# Patient Record
Sex: Female | Born: 1955 | Race: White | Hispanic: No | Marital: Married | Smoking: Never smoker
Health system: Southern US, Community
[De-identification: ages and names within clinical notes are randomized; demographics above are authoritative.]

## PROBLEM LIST (undated history)

## (undated) DIAGNOSIS — M1990 Unspecified osteoarthritis, unspecified site: Secondary | ICD-10-CM

## (undated) DIAGNOSIS — R9389 Abnormal findings on diagnostic imaging of other specified body structures: Secondary | ICD-10-CM

## (undated) DIAGNOSIS — E569 Vitamin deficiency, unspecified: Secondary | ICD-10-CM

## (undated) DIAGNOSIS — K760 Fatty (change of) liver, not elsewhere classified: Secondary | ICD-10-CM

## (undated) DIAGNOSIS — K635 Polyp of colon: Secondary | ICD-10-CM

## (undated) DIAGNOSIS — M329 Systemic lupus erythematosus, unspecified: Secondary | ICD-10-CM

## (undated) DIAGNOSIS — E079 Disorder of thyroid, unspecified: Secondary | ICD-10-CM

## (undated) DIAGNOSIS — IMO0002 Reserved for concepts with insufficient information to code with codable children: Secondary | ICD-10-CM

## (undated) DIAGNOSIS — E785 Hyperlipidemia, unspecified: Secondary | ICD-10-CM

## (undated) DIAGNOSIS — H9190 Unspecified hearing loss, unspecified ear: Secondary | ICD-10-CM

## (undated) DIAGNOSIS — M3500 Sicca syndrome, unspecified: Secondary | ICD-10-CM

## (undated) DIAGNOSIS — L219 Seborrheic dermatitis, unspecified: Secondary | ICD-10-CM

## (undated) DIAGNOSIS — D649 Anemia, unspecified: Secondary | ICD-10-CM

## (undated) DIAGNOSIS — K5790 Diverticulosis of intestine, part unspecified, without perforation or abscess without bleeding: Secondary | ICD-10-CM

## (undated) HISTORY — DX: Polyp of colon: K63.5

## (undated) HISTORY — PX: OTHER SURGICAL HISTORY: SHX169

## (undated) HISTORY — DX: Unspecified hearing loss, unspecified ear: H91.90

## (undated) HISTORY — DX: Anemia, unspecified: D64.9

## (undated) HISTORY — DX: Systemic lupus erythematosus, unspecified: M32.9

## (undated) HISTORY — DX: Unspecified osteoarthritis, unspecified site: M19.90

## (undated) HISTORY — DX: Hyperlipidemia, unspecified: E78.5

## (undated) HISTORY — DX: Fatty (change of) liver, not elsewhere classified: K76.0

## (undated) HISTORY — DX: Reserved for concepts with insufficient information to code with codable children: IMO0002

## (undated) HISTORY — DX: Sjogren syndrome, unspecified: M35.00

## (undated) HISTORY — DX: Diverticulosis of intestine, part unspecified, without perforation or abscess without bleeding: K57.90

## (undated) HISTORY — DX: Vitamin deficiency, unspecified: E56.9

## (undated) HISTORY — DX: Seborrheic dermatitis, unspecified: L21.9

## (undated) HISTORY — DX: Abnormal findings on diagnostic imaging of other specified body structures: R93.89

## (undated) HISTORY — DX: Disorder of thyroid, unspecified: E07.9

---

## 2008-02-22 HISTORY — PX: ESOPHAGOGASTRODUODENOSCOPY: SHX1529

## 2008-02-22 HISTORY — PX: COLONOSCOPY: SHX174

## 2011-11-21 ENCOUNTER — Encounter (INDEPENDENT_AMBULATORY_CARE_PROVIDER_SITE_OTHER): Payer: Self-pay | Admitting: Surgery

## 2011-11-21 ENCOUNTER — Ambulatory Visit (INDEPENDENT_AMBULATORY_CARE_PROVIDER_SITE_OTHER): Payer: Private Health Insurance - Indemnity | Admitting: Surgery

## 2011-11-21 VITALS — BP 110/61 | HR 61 | Temp 98.0°F | Resp 18 | Ht 65.0 in | Wt 125.6 lb

## 2011-11-21 DIAGNOSIS — L723 Sebaceous cyst: Secondary | ICD-10-CM

## 2011-11-21 DIAGNOSIS — L089 Local infection of the skin and subcutaneous tissue, unspecified: Secondary | ICD-10-CM | POA: Insufficient documentation

## 2011-11-21 NOTE — Progress Notes (Signed)
Subjective:     Patient ID: Brittney Jones, female   DOB: [birth year], 56 y.o.   MRN: [id number]  HPI She is here for evaluation of an infected sebaceous cyst on her left buttock. It has been present for many years but recently became tender and painful last week.  Review of Systems     Objective:   Physical Exam On exam, she indeed has an infected sebaceous cyst on the left buttock near the midline above the gluteal cleft. I prepped the area Betadine, anesthetized with lidocaine, an incision with a scalpel, and and drained an abscess cavity. I then packed with gauze    Assessment:     Infected sebaceous cyst    Plan:     I am going to place her on Keflex and hydrocodone. Wound care instructions were given. I will see her back in one week

## 2011-11-22 ENCOUNTER — Telehealth (INDEPENDENT_AMBULATORY_CARE_PROVIDER_SITE_OTHER): Payer: Self-pay | Admitting: General Surgery

## 2011-11-22 NOTE — Telephone Encounter (Signed)
Pt called and has daughter there to help with dressing change.  She is a Theatre stage manager.  Discussed how to preform the dressing change, using clean technique.  Advised to take pain medicine about 30 minutes ahead.  She understands.

## 2011-11-29 ENCOUNTER — Ambulatory Visit (INDEPENDENT_AMBULATORY_CARE_PROVIDER_SITE_OTHER): Payer: Private Health Insurance - Indemnity | Admitting: Surgery

## 2011-11-29 VITALS — BP 119/60 | HR 80 | Temp 97.7°F | Resp 16 | Ht 65.0 in | Wt 128.4 lb

## 2011-11-29 DIAGNOSIS — Z09 Encounter for follow-up examination after completed treatment for conditions other than malignant neoplasm: Secondary | ICD-10-CM

## 2011-11-29 NOTE — Progress Notes (Signed)
Subjective:     Patient ID: Brittney Jones, female   DOB: 08-11-1955, 55 y.o.   MRN: [id number]  HPI She is here for a followup visit status post incision and drainage of a infected sebaceous cyst on her buttock She has no complaints. Review of Systems     Objective:   Physical Exam The incision and drainage site in the left upper buttock is healed well    Assessment:     Patient stable post incision and drainage of infected sebaceous cyst    Plan:     She will come back and see me if the cyst Recurs

## 2014-09-25 ENCOUNTER — Encounter: Payer: Self-pay | Admitting: Gastroenterology

## 2014-11-19 ENCOUNTER — Ambulatory Visit (INDEPENDENT_AMBULATORY_CARE_PROVIDER_SITE_OTHER): Payer: Managed Care, Other (non HMO) | Admitting: Gastroenterology

## 2014-11-19 ENCOUNTER — Encounter: Payer: Self-pay | Admitting: Gastroenterology

## 2014-11-19 VITALS — BP 118/60 | HR 66 | Ht 65.0 in | Wt 150.0 lb

## 2014-11-19 DIAGNOSIS — R195 Other fecal abnormalities: Secondary | ICD-10-CM | POA: Diagnosis not present

## 2014-11-19 DIAGNOSIS — D649 Anemia, unspecified: Secondary | ICD-10-CM | POA: Diagnosis not present

## 2014-11-19 MED ORDER — NA SULFATE-K SULFATE-MG SULF 17.5-3.13-1.6 GM/177ML PO SOLN
ORAL | Status: DC
Start: 2014-11-19 — End: 2014-12-25

## 2014-11-19 MED ORDER — OMEPRAZOLE 40 MG PO CPDR: 40.0000 mg | DELAYED_RELEASE_CAPSULE | Freq: Every day | ORAL | Status: DC

## 2014-11-19 NOTE — Patient Instructions (Signed)
We have sent the following medications to your pharmacy for you to pick up at your convenience: Suprep; Omeprazole  You have been scheduled for an endoscopy and colonoscopy. Please follow the written instructions given to you at your visit today. Please pick up your prep supplies at the pharmacy within the next 1-3 days. If you use inhalers (even only as needed), please bring them with you on the day of your procedure. Your physician has requested that you go to www.startemmi.com and enter the access code given to you at your visit today. This web site gives a general overview about your procedure. However, you should still follow specific instructions given to you by our office regarding your preparation for the procedure.

## 2014-11-19 NOTE — Progress Notes (Signed)
HPI :  59 y/o female seen in consultation for symptoms of blood in the stools. She is new to our clinic.   She reports she had new symptoms of some "dark and tarry stools" which started in the November / December time frame, which has occurred intermittently since that time. At present time she sees both dark, tarry stools, as well as some stools with bright red blood, that occur intermittently, roughly every other day. She denies any abdominal pains or perianal pain. She has some baseline constipation, takes probiotic, having roughly a few BMS per day at this time and is stable. She states she sees blood intermittently, but the dark stools are occuring fairly frequently. She takes an iron supplement and has been on this since 2000 for reported anemia (old labs not available). She reports her stool color has significantly changed since November but states she was on iron prior to this. She denies any significant change in weight during this time. She exercises a few times per week without limitations. No chest pain or shortness of breath. She eats well, no dysphagia but endorses a "tightness" in her esophagus when she swallows at times. No vomiting. She does have some postprandial abdominal discomfort in her abdomen at times after she eats, in her mid left upper abdomen which can come and go. She denies NSAIDs. No aspirin.   She reports having had a colonoscopy done about 7 years ago. Report not available at the time. No polyps per patient, but sigmoid diverticulosis. She also had an EGD for dysphagia she thinks around 7 years ago as well. No problems per patient. We are in process of obtaining these reports.   She has a FH of colon polyps in her parents, she thinks an aunt or uncle may have had colon cancer but is unclear who has it.   She report she has been anemic historically and has been on iron for this issue.   She thinks she was told she has Sjogren's syndrome based on prior lip biopsy, and  states she has "positive lupus tests". She also reports a history of pernicious anemia and is on B12. She denies routine alcohol use.      Past Medical History  Diagnosis Date  . Arthritis   . Diverticulosis   . Hyperlipidemia   . Thyroid disease   . Lupus   . Dermatitis seborrheica   . Problems with hearing     auto immune  . Vitamin deficiency     several  . Anemia     both pernicious anemia and iron deficiency per patient  . Fatty liver      Past Surgical History  Procedure Laterality Date  . Lip biospy    . Scalp biospy    . Colonoscopy  2010    in [place]  . Esophagogastroduodenoscopy  2010    in [place]   Family History  Problem Relation Age of Onset  . Breast cancer      mat great aunt  . Colon polyps Mother   . Colon polyps Father   . Colon polyps Brother   . Colon polyps Sister   . Clotting disorder Maternal Uncle   . Diabetes Paternal Aunt   . Stroke Maternal Uncle   . Liver disease      multiple people on mom's side  . Heart disease Mother   . Heart disease Father   . Kidney cancer Paternal Grandmother   . Leukemia Maternal Grandmother   .  Lung cancer Maternal Uncle   . Stroke Maternal Grandfather    Social History  Substance Use Topics  . Smoking status: Never Smoker   . Smokeless tobacco: Never Used  . Alcohol Use: 0.0 oz/week    0 Standard drinks or equivalent per week   Current Outpatient Prescriptions  Medication Sig Dispense Refill  . Choline & Mag Salicylates 4098 MG TABS Take by mouth daily.    Marland Kitchen DHEA 10 MG TABS Take 10 mg by mouth daily.    . Digestive Enzymes (DIGESTIVE ENZYME PO) Take by mouth daily.    . fluconazole (DIFLUCAN) 200 MG tablet Take 200 mg by mouth as needed.    Marland Kitchen levothyroxine (SYNTHROID, LEVOTHROID) 50 MCG tablet Take 50 mcg by mouth daily before breakfast.    . liothyronine (CYTOMEL) 25 MCG tablet Take 25 mcg by mouth daily.    . Magnesium 400 MG CAPS Take 400 mg by mouth daily.    . Methylcobalamin (B12-ACTIVE PO) Take  by mouth daily.    . Misc Natural Products (COLON CLEANSE PO) Take by mouth. 3 times a year    . Multiple Vitamins-Minerals (MULTIVITAMIN ADULT PO) Take by mouth daily.    . Nutritional Supplements (CELLULAR FORTE PO) Take by mouth daily.    . Omega 3 1200 MG CAPS Take by mouth daily.    . Probiotic Product (PROBIOTIC ADVANCED PO) Take by mouth daily.    . Na Sulfate-K Sulfate-Mg Sulf SOLN Take as directed per Colonoscopy. 354 mL 0  . omeprazole (PRILOSEC) 40 MG capsule Take 1 capsule (40 mg total) by mouth daily. 60 capsule 1   No current facility-administered medications for this visit.   Also takes several vitamin supplements  No Known Allergies   Review of Systems: All systems reviewed and negative except where noted in HPI.   No labs in Epic  Labs from her primary care brought show (+) FOBT testing, iron levels normal as of 09/22/2014, Hgb of 12.9 with MCV of 89, while on oral iron  Physical Exam: BP 118/60 mmHg  Pulse 66  Ht 5\' 5"  (1.651 m)  Wt 150 lb (68.04 kg)  BMI 24.96 kg/m2 Constitutional: Pleasant, white female in no acute distress. HEENT: Normocephalic and atraumatic. Conjunctivae are normal. No scleral icterus. Neck supple.  Cardiovascular: Normal rate, regular rhythm.  Pulmonary/chest: Effort normal and breath sounds normal. No wheezing, rales or rhonchi. Abdominal: Soft, nondistended, nontender. Bowel sounds active throughout. There are no masses palpable. No hepatomegaly. Rectal exam deferred.  Extremities: no edema Lymphadenopathy: No cervical adenopathy noted. Neurological: Alert and oriented to person place and time. Skin: Skin is warm and dry. No rashes noted. Psychiatric: Normal mood and affect. Behavior is normal.   ASSESSMENT AND PLAN: 59 y/o female with history as above, seen in consultation for symptoms of blood in the stools. The patient has had "dark stools" and stools with bright red blood for several months. She is FOBT positive. Recent labs on  iron show normal Hgb and iron stores, however she tells me she has a history of both iron deficiency and pernicious anemia. Other than longstanding mild dysphagia she otherwise has no abdominal pain, weight loss, or significant changes in bowels. Last endoscopic exam was 7 years ago, we will obtain these reports but patient states nothing significant noted.   Given her history of seeing blood in stools and reported anemia prior to starting iron, recommend we start with upper and lower endoscopy to further evaluate these complaints. In the interim  until this is done, recommend she try empiric PPI daily given her report of "dark stools", will start omeprazole 40mg  once daily, and ask that she avoid NSAIDs. If our exam is unremarkable may consider capsule endoscopy. Of note, given her history of pernicious anemia will plan on biopsies of the stomach even if normal. Deferred rectal exam to time of colonoscopy.   The indications, risks, and benefits of EGD and colonoscopy were explained to the patient in detail. Risks include but are not limited to bleeding, perforation, adverse reaction to medications, and cardiopulmonary compromise. Sequelae include but are not limited to the possibility of surgery, hositalization, and mortality. The patient verbalized understanding and wished to proceed. All questions answered, referred to scheduler and bowel prep ordered. Further recommendations pending results of the exam.   [place] Cellar, MD [facility]

## 2014-11-20 ENCOUNTER — Telehealth: Payer: Self-pay

## 2014-11-20 ENCOUNTER — Encounter: Payer: Self-pay | Admitting: Gastroenterology

## 2014-11-20 NOTE — Telephone Encounter (Signed)
Called patient to clarify the question she was asking. I told her I would respond once I hear from Dr. Havery Moros

## 2014-11-20 NOTE — Telephone Encounter (Signed)
Dr. Havery Moros specifically wants to do the [facility] test with you taking omeprazole as he may be looking for how well you react to the medications. I will send him a note with your concerns and contact you when he responds.

## 2014-11-22 DIAGNOSIS — R9389 Abnormal findings on diagnostic imaging of other specified body structures: Secondary | ICD-10-CM

## 2014-11-22 HISTORY — DX: Abnormal findings on diagnostic imaging of other specified body structures: R93.89

## 2014-11-25 NOTE — Telephone Encounter (Signed)
Called pt and informed her what Dr Havery Moros suggested.

## 2014-12-02 ENCOUNTER — Telehealth: Payer: Self-pay | Admitting: Gastroenterology

## 2014-12-02 NOTE — Telephone Encounter (Signed)
Records obtained from prior workup for the patient. This note is to document the following  Colonoscopy 08/19/2008 - mild sigmoid diverticulosis, otherwise normal colon and ileum EGD 03/28/2008 - normal esophagus. Random biopsies obtained to rule out EoE. Empiric dilation with 54Fr [facility] dilator performed. Normal stomach and duodenum.  We will await results of her scheduled endoscopy and course on empiric PPI.

## 2014-12-25 ENCOUNTER — Encounter: Payer: Self-pay | Admitting: Gastroenterology

## 2014-12-25 ENCOUNTER — Ambulatory Visit (AMBULATORY_SURGERY_CENTER): Payer: Managed Care, Other (non HMO) | Admitting: Gastroenterology

## 2014-12-25 VITALS — BP 113/68 | HR 72 | Temp 97.3°F | Resp 25 | Ht 65.0 in | Wt 150.0 lb

## 2014-12-25 DIAGNOSIS — D120 Benign neoplasm of cecum: Secondary | ICD-10-CM | POA: Diagnosis not present

## 2014-12-25 DIAGNOSIS — D125 Benign neoplasm of sigmoid colon: Secondary | ICD-10-CM | POA: Diagnosis not present

## 2014-12-25 DIAGNOSIS — D649 Anemia, unspecified: Secondary | ICD-10-CM | POA: Diagnosis not present

## 2014-12-25 DIAGNOSIS — D122 Benign neoplasm of ascending colon: Secondary | ICD-10-CM

## 2014-12-25 DIAGNOSIS — K2970 Gastritis, unspecified, without bleeding: Secondary | ICD-10-CM

## 2014-12-25 DIAGNOSIS — R195 Other fecal abnormalities: Secondary | ICD-10-CM

## 2014-12-25 DIAGNOSIS — K2990 Gastroduodenitis, unspecified, without bleeding: Secondary | ICD-10-CM

## 2014-12-25 DIAGNOSIS — K633 Ulcer of intestine: Secondary | ICD-10-CM | POA: Diagnosis not present

## 2014-12-25 MED ORDER — SODIUM CHLORIDE 0.9 % IV SOLN: 500.0000 mL | INTRAVENOUS | Status: DC

## 2014-12-25 NOTE — Patient Instructions (Signed)

## 2014-12-25 NOTE — Op Note (Signed)
[place]  [address] [place], [address]   ENDOSCOPY PROCEDURE REPORT  PATIENT: Brittney, Jones  MR#: [id number] BIRTHDATE: 03-15-1955 , 59  yrs. old GENDER: female ENDOSCOPIST: Yetta Flock, MD REFERRED BY: PROCEDURE DATE:  12/25/2014 PROCEDURE:  EGD w/ biopsy ASA CLASS:     Class II INDICATIONS:  history of blood in the stools, reported iron deficiency, reported pernicious anemia. MEDICATIONS: Propofol 250 mg IV TOPICAL ANESTHETIC:  DESCRIPTION OF PROCEDURE: After the risks benefits and alternatives of the procedure were thoroughly explained, informed consent was obtained.  The LB [id number] endoscope was introduced through the mouth and advanced to the second portion of the duodenum , Without limitations.  The instrument was slowly withdrawn as the mucosa was fully examined.  FINDINGS:The esophagus was normal.  No stenosis or stricture noted. SCJ was normal.  DH, GEJ, and SCJ were located 38cm from the incisors.  The stomach had some mild, subtle, patchy erythema but otherwise was normal.  Biopsies were taken from the antrum and body to evaluate for auto-immune gastritis and intestinal metaplasia given the reported history of pernicious anemia.  The duodenal bulb and 2nd portion of the duodenum were normal.  Random biopsies taken to rule out celiac in light of reported history of iron deficiency. Retroflexed views revealed no abnormalities.     The scope was then withdrawn from the patient and the procedure completed.  COMPLICATIONS: There were no immediate complications.  ENDOSCOPIC IMPRESSION: Normal esophagus Mild subtle patchy erythema of the stomach, biopsies taken as above Normal duodenum, biopsies taken as above    RECOMMENDATIONS: Await pathology results Resume medications Resume diet    eSigned:  Yetta Flock, MD 12/25/2014 4:19 PM    CC: the patient  PATIENT NAME:  Brittney, Jones MR#: [id number]

## 2014-12-25 NOTE — Progress Notes (Signed)
Transferred to recovery room. A/O x3, pleased with MAC.  VSS.  Report to Penny, RN. 

## 2014-12-25 NOTE — Op Note (Signed)
[place]  [address] [place], [address]   COLONOSCOPY PROCEDURE REPORT  PATIENT: Brittney, Jones  MR#: [id number] BIRTHDATE: 16-Aug-1955 , 59  yrs. old GENDER: female ENDOSCOPIST: Yetta Flock, MD REFERRED BY: PROCEDURE DATE:  12/25/2014 PROCEDURE:   Colonoscopy, diagnostic and Colonoscopy with biopsy First Screening Colonoscopy - Avg.  risk and is 50 yrs.  old or older - No.  Prior Negative Screening - Now for repeat screening. Other: See Comments  History of Adenoma - Now for follow-up colonoscopy & has been > or = to 3 yrs.  N/A  Polyps removed today? Yes ASA CLASS:   Class II INDICATIONS:Evaluation of unexplained GI bleeding and Colorectal Neoplasm Risk Assessment for this procedure is average risk. MEDICATIONS: Propofol 450 mg IV  DESCRIPTION OF PROCEDURE:   After the risks benefits and alternatives of the procedure were thoroughly explained, informed consent was obtained.  The digital rectal exam revealed no abnormalities of the rectum.   The LB [id number]  endoscope was introduced through the anus and advanced to the terminal ileum which was intubated for a short distance. No adverse events experienced.   The quality of the prep was adequate  The instrument was then slowly withdrawn as the colon was fully examined. Estimated blood loss is zero unless otherwise noted in this procedure report.  COLON FINDINGS: A 58mm sessile polyp was noted in the cecum and removed with cold forceps.  A diminutive sessile polyp was noted in the ascending colon and removed with cold forceps.  A 40mm sessile polyp was noted in the sigmoid colon and in the rectum, both removed with cold forceps.  The remainder of the examined colonic mucosa was normal. Two non bleeding apthous ulcerations were noted in the terminal ileum.  The ileum was otherwise normal without other inflammatory changes. Biopsies were obtained from the ileum. Retroflexed views revealed  internal hemorrhoids. The time to cecum = 6.9 Withdrawal time = 20.6   The scope was withdrawn and the procedure completed. COMPLICATIONS: There were no immediate complications.  ENDOSCOPIC IMPRESSION: 4 colon polyps removed as outlined above Two apthous ulcerations noted in the terminal ileum of unclear signficance - biopsies taken to rule out Crohns disease Otherwise normal appearing colon Small internal hemorrhoids noted which could account for some of the patient's rectal bleeding  RECOMMENDATIONS: Daily fiber supplement as tolerated to treat hemorrhoids Await pathology results Avoid all NSAIDs Resume diet Resume medications Consideration for capsule endoscopy to further evaluate the small bowel. We will first await pathology results.  eSigned:  Yetta Flock, MD 12/25/2014 4:13 PM   cc:  the patient   PATIENT NAME:  Brittney, Jones MR#: [id number]

## 2014-12-25 NOTE — Progress Notes (Signed)
Called to room to assist during endoscopic procedure.  Patient ID and intended procedure confirmed with present staff. Received instructions for my participation in the procedure from the performing physician.  

## 2014-12-26 ENCOUNTER — Other Ambulatory Visit: Payer: Self-pay | Admitting: Internal Medicine

## 2014-12-26 ENCOUNTER — Telehealth: Payer: Self-pay | Admitting: *Deleted

## 2014-12-26 DIAGNOSIS — E041 Nontoxic single thyroid nodule: Secondary | ICD-10-CM

## 2014-12-26 NOTE — Telephone Encounter (Signed)
  Follow up Call-  Call back number 12/25/2014  Post procedure Call Back phone  # [phone]  Permission to leave phone message Yes    [facility]

## 2014-12-31 ENCOUNTER — Other Ambulatory Visit: Payer: Managed Care, Other (non HMO)

## 2015-01-02 ENCOUNTER — Telehealth: Payer: Self-pay | Admitting: Gastroenterology

## 2015-01-02 NOTE — Telephone Encounter (Signed)
Spoke with patient and reviewed results and recommendations.

## 2015-01-06 ENCOUNTER — Ambulatory Visit (INDEPENDENT_AMBULATORY_CARE_PROVIDER_SITE_OTHER): Payer: Self-pay | Admitting: Internal Medicine

## 2015-01-06 ENCOUNTER — Other Ambulatory Visit: Payer: Self-pay

## 2015-01-06 DIAGNOSIS — D508 Other iron deficiency anemias: Secondary | ICD-10-CM

## 2015-01-06 DIAGNOSIS — D509 Iron deficiency anemia, unspecified: Secondary | ICD-10-CM

## 2015-01-06 NOTE — Progress Notes (Signed)
Patient here for capsule endo teaching. Verbalizes understanding for all written and verbal instructions. 

## 2015-01-07 ENCOUNTER — Encounter: Payer: Self-pay | Admitting: Internal Medicine

## 2015-01-08 ENCOUNTER — Ambulatory Visit (INDEPENDENT_AMBULATORY_CARE_PROVIDER_SITE_OTHER): Payer: Managed Care, Other (non HMO) | Admitting: Gastroenterology

## 2015-01-08 DIAGNOSIS — D649 Anemia, unspecified: Secondary | ICD-10-CM

## 2015-01-08 NOTE — Progress Notes (Signed)
Patient here for capsule endoscopy.  Patient has completed the prep and has not eaten this AM. Patient swallowed capsule.Tolerated procedure. Verbalizes understanding of written and verbal instructions. Capsule # [id number] Expires 2017-12.

## 2015-01-09 ENCOUNTER — Telehealth: Payer: Self-pay | Admitting: Gastroenterology

## 2015-01-09 NOTE — Telephone Encounter (Signed)
Noted  

## 2015-01-12 ENCOUNTER — Telehealth: Payer: Self-pay | Admitting: Gastroenterology

## 2015-01-12 DIAGNOSIS — D509 Iron deficiency anemia, unspecified: Secondary | ICD-10-CM

## 2015-01-12 NOTE — Telephone Encounter (Signed)
Brittney Jones can you please let this patient know capsule results returned. There was a suggestion of mild inflammation in the duodenum but I previously biopsied this and was normal on EGD. There was otherwise no evidence of Crohns disease of the small bowel. If she has not had celiac serology (total IgA and TTG IgA ) can you please order this lab for her. Otherwise, recommend she avoid NSAIDs and see me in the clinic for a follow up to discuss results, but I don't think she has Crohns based on the capsule study. We will need to monitor her over time as things can change however. I will discuss this further with her in clinic. Thanks

## 2015-01-13 ENCOUNTER — Other Ambulatory Visit (INDEPENDENT_AMBULATORY_CARE_PROVIDER_SITE_OTHER): Payer: Managed Care, Other (non HMO)

## 2015-01-13 ENCOUNTER — Encounter: Payer: Self-pay | Admitting: Gastroenterology

## 2015-01-13 DIAGNOSIS — D509 Iron deficiency anemia, unspecified: Secondary | ICD-10-CM

## 2015-01-13 LAB — IGA: IgA: 117 mg/dL (ref 68–378)

## 2015-01-13 NOTE — Telephone Encounter (Signed)
Lab in EPIC. Left a message for patient to call back for results.

## 2015-01-13 NOTE — Telephone Encounter (Signed)
Patient given results and recommendations. She will have labs done.

## 2015-01-14 LAB — TISSUE TRANSGLUTAMINASE, IGA: Tissue Transglutaminase Ab, IgA: 1 U/mL (ref <4)

## 2015-01-16 ENCOUNTER — Encounter: Payer: Self-pay | Admitting: Gastroenterology

## 2015-03-12 ENCOUNTER — Ambulatory Visit (INDEPENDENT_AMBULATORY_CARE_PROVIDER_SITE_OTHER): Payer: Managed Care, Other (non HMO) | Admitting: Gastroenterology

## 2015-03-12 ENCOUNTER — Encounter: Payer: Self-pay | Admitting: Gastroenterology

## 2015-03-12 VITALS — BP 100/60 | HR 78 | Ht 65.0 in | Wt 151.0 lb

## 2015-03-12 DIAGNOSIS — D510 Vitamin B12 deficiency anemia due to intrinsic factor deficiency: Secondary | ICD-10-CM

## 2015-03-12 DIAGNOSIS — K649 Unspecified hemorrhoids: Secondary | ICD-10-CM

## 2015-03-12 DIAGNOSIS — K3189 Other diseases of stomach and duodenum: Secondary | ICD-10-CM | POA: Diagnosis not present

## 2015-03-12 DIAGNOSIS — K2940 Chronic atrophic gastritis without bleeding: Secondary | ICD-10-CM | POA: Diagnosis not present

## 2015-03-12 DIAGNOSIS — K31A0 Gastric intestinal metaplasia, unspecified: Secondary | ICD-10-CM

## 2015-03-12 NOTE — Patient Instructions (Signed)
Please call the office when you are ready to schedule your EGD. [phone]

## 2015-03-12 NOTE — Progress Notes (Signed)
HPI :  LAST VISIT: 60 y/o female seen in consultation for symptoms of blood in the stools. She is new to our clinic.   She reports she had new symptoms of some "dark and tarry stools" which started in the November / December time frame, which has occurred intermittently since that time. At present time she sees both dark, tarry stools, as well as some stools with bright red blood, that occur intermittently, roughly every other day. She denies any abdominal pains or perianal pain. She has some baseline constipation, takes probiotic, having roughly a few BMS per day at this time and is stable. She states she sees blood intermittently, but the dark stools are occuring fairly frequently. She takes an iron supplement and has been on this since 2000 for reported anemia (old labs not available). She reports her stool color has significantly changed since November but states she was on iron prior to this. She denies any significant change in weight during this time. She exercises a few times per week without limitations. No chest pain or shortness of breath. She eats well, no dysphagia but endorses a "tightness" in her esophagus when she swallows at times. No vomiting. She does have some postprandial abdominal discomfort in her abdomen at times after she eats, in her mid left upper abdomen which can come and go. She denies NSAIDs. No aspirin.   She reports having had a colonoscopy done about 7 years ago. Report not available at the time. No polyps per patient, but sigmoid diverticulosis. She also had an EGD for dysphagia she thinks around 7 years ago as well. No problems per patient. We are in process of obtaining these reports.   She has a FH of colon polyps in her parents, she thinks an aunt or uncle may have had colon cancer but is unclear who has it. She report she has been anemic historically and has been on iron for this issue.   She thinks she was told she has Sjogren's syndrome based on prior lip  biopsy, and states she has "positive lupus tests". She also reports a history of pernicious anemia and is on B12. She denies routine alcohol use.  SINCE LAST VISIT:  Patient here for follow up. She is feeling okay in general without any significant symptoms. She had had an EGD, colonoscopy, and capsule study since our last visit with results as outlined below. She denies any NSAID use. She is having roughly 1-2 BMs per day, on probiotics. No diarrhea. Sometimes stools are dark and this occurs intermittently, but she is taking an oral iron supplement. Weight is stable. No abdominal pains. She had som nausea she thinks in relation to vitamin intake, not bothering her much now. She has some rare scant amount red blood in the stools thought to be due to hemorrhoids. She has had negative lab testing for celiac disease.    EGD 12/25/14 - subtle gastric erythema. 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 Otherwise, she had some apthous ulcerations in the ileum noted and hemorrhoids Capsule endoscopy 01/08/15 - no evidence of Crohns disease, normal appearing small bowel  Past Medical History  Diagnosis Date  . Arthritis   . Diverticulosis   . Hyperlipidemia   . Thyroid disease   . Lupus ([place])   .  Dermatitis seborrheica   . Problems with hearing     auto immune  . Vitamin deficiency     several  . Fatty liver   . Abnormal thermography 11/2014    left breast  . Anemia     both pernicious anemia and iron deficiency per patient  . Colon polyp     2 adenomas 2016     Past Surgical History  Procedure Laterality Date  . Lip biospy    . Scalp biospy    . Colonoscopy  2010    in [place]  . Esophagogastroduodenoscopy  2010    in [place]   Family History  Problem Relation Age of Onset  . Breast  cancer      mat great aunt  . Colon polyps Mother   . Colon polyps Father   . Colon polyps Brother   . Colon polyps Sister   . Clotting disorder Maternal Uncle   . Diabetes Paternal Aunt   . Stroke Maternal Uncle   . Liver disease      multiple people on mom's side  . Heart disease Mother   . Heart disease Father   . Kidney cancer Paternal Grandmother   . Leukemia Maternal Grandmother   . Lung cancer Maternal Uncle   . Stroke Maternal Grandfather    Social History  Substance Use Topics  . Smoking status: Never Smoker   . Smokeless tobacco: Never Used  . Alcohol Use: 0.0 oz/week    0 Standard drinks or equivalent per week   Current Outpatient Prescriptions  Medication Sig Dispense Refill  . Cholecalciferol (VITAMIN D3) 10000 units capsule Take 10,000 Units by mouth daily.    . Choline & Mag Salicylates 123XX123 MG TABS Take by mouth daily.    Marland Kitchen DHEA 10 MG TABS Take 10 mg by mouth daily.    . Digestive Enzymes (DIGESTIVE ENZYME PO) Take by mouth daily.    . fluconazole (DIFLUCAN) 200 MG tablet Take 200 mg by mouth as needed.    Marland Kitchen levothyroxine (SYNTHROID, LEVOTHROID) 50 MCG tablet Take 50 mcg by mouth daily before breakfast.    . liothyronine (CYTOMEL) 25 MCG tablet Take 25 mcg by mouth daily.    . Magnesium 400 MG CAPS Take 400 mg by mouth daily.    . Methylcobalamin (B12-ACTIVE PO) Take 5,000 mg by mouth daily.     . Misc Natural Products (COLON CLEANSE PO) Take by mouth. 3 times a year    . Multiple Vitamins-Minerals (MULTIVITAMIN ADULT PO) Take by mouth daily.    . Nutritional Supplements (CELLULAR FORTE PO) Take by mouth daily.    . Omega 3 1200 MG CAPS Take by mouth daily.    . Probiotic Product (PROBIOTIC ADVANCED PO) Take by mouth daily.     No current facility-administered medications for this visit.   No Known Allergies   Review of Systems: All systems reviewed and negative except where noted in HPI.   Normal TTG IgA and IgA levels   Physical Exam: BP 100/60  mmHg  Pulse 78  Ht 5\' 5"  (1.651 m)  Wt 151 lb (68.493 kg)  BMI 25.13 kg/m2 Constitutional: Pleasant,well-developed, female in no acute distress. HEENT: Normocephalic and atraumatic. Conjunctivae are normal. No scleral icterus. Neck supple.  Cardiovascular: Normal rate, regular rhythm.  Pulmonary/chest: Effort normal and breath sounds normal. No wheezing, rales or rhonchi. Abdominal: Soft, nondistended, nontender. Bowel sounds active throughout. There are no masses palpable. No hepatomegaly. Extremities: no edema Lymphadenopathy: No cervical adenopathy  noted. Neurological: Alert and oriented to person place and time. Skin: Skin is warm and dry. No rashes noted. Psychiatric: Normal mood and affect. Behavior is normal.   ASSESSMENT AND PLAN: 59 y/o female with history of pernicious anemia and reported iron deficiency, seen initially in consultation for blood in the stools. The patient had "dark stools" although in the setting of taking oral iron, as well as bright red blood for several months. She was FOBT positive. Colonoscopy showed internal hemorrhoids as the likely cause of her rectal bleeding, with 2 small adenomas, and 2 small apthous ulcerations in the ileum. Biopsies of this area showed focal active inflammation without evidence of chronicity. Capsule endoscopy did not show any evidence of small bowel Crohn's. 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   At this time, regarding her "dark stools" suspect it could be due to her iron supplementation. I did not appreciate any pathology on this workup to cause overt bleeding other than hemorrhoids. I don't think she has Crohn's disease based on lack of chronicity on small bowel biopsies and normal appearing small bowel on capsule. The ileal changes on colonoscopy were quite mild. She denies NSAID use. Unclear if that was a self limited infectious etiology. She generally  feels well without diarrhea or abdominal pain at this time. I would monitor her for symptoms and her labs over time, if she does develop diarrhea or abdominal pain, would consider repeating imaging or colonoscopy to re-evaluate the ileum.   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 I discussed this with them and potential increased risk of gastric malignancy given pernicious anemia and intestinal metaplasia, however the overall risk is thought to remain low. I reviewed the ASGE guidelines on surveillance endoscopy for premalignant conditions of the GI tract. It is controversial whether or not to survey GIM without dysplasia, guidelines currently don't recommend it without other risk factors, as the risk of malignancy in the [place] is thought to be low, but some advocate surveillance or gastric mapping to determine the extent and severity of GIM and assess for dysplastic changes. I offered a gastric mapping procedure to her, although she wishes to think about it, mostly due to cost as her insurance won't cover it at present time and she would pay out of pocket. She will get back to me if she wishes to have a follow up EGD.   I would otherwise monitor her for symptoms, and trend her CBC over time. She can follow up in a year for reassessment or sooner with development of symptoms. Repeat surveillance colonoscopy is recommended in 5 years.   [place] Cellar, MD [facility] Pager [phone]

## 2015-03-19 ENCOUNTER — Encounter: Payer: Self-pay | Admitting: Gastroenterology

## 2015-03-23 ENCOUNTER — Telehealth: Payer: Self-pay | Admitting: *Deleted

## 2015-03-23 NOTE — Telephone Encounter (Signed)
Patient notified of Amy's information and patient given billing's number to call also.

## 2015-03-23 NOTE — Telephone Encounter (Signed)
INSURANCE REQUIRES AN AUTH IF SHE DECIDES TO DO IT. ITS DATE SPECIFIC SO I HAVE TO DO THE AUTH WHEN EVER ITS SCHEDULED. DR A SAYS ITS AN EGD THAT CAN BE DONE IN [facility].       Previous Messages     ----- Message -----   From: Manus Gunning, MD   Sent: 03/20/2015  7:11 AM    To: Darden Dates   Thanks for the email. To clarify, yes it's an EGD. Basically a lot of biopsies done throughout the stomach but it's an EGD, and can be done at the [facility]. Thanks       Spoke with patient and she would like to know the cost of the procedure. She states she has a high deductible and is expecting to have to pay all of it. She understands she will need a precert to have the procedure.

## 2015-03-23 NOTE — Telephone Encounter (Signed)
Left a message for patient to call back. 

## 2015-03-23 NOTE — Telephone Encounter (Signed)
I DONT HAVE EXACT PRICING.  [facility] WOULD HAVE THAT.  I KNOW JUST PHYS AND FACILITY CHARGES WOULD BE APPROX 1200-1500 THAT DOES NOT INCLUDE ANESTHESIA OR PATHOLOGY.  I DONT HAVE THOSE CHARGES.  THAT 1200-1500 IS JUST EGD PROCEDURE WITH PHYSICIAN. SORRY I DONT HAVE ALL COST.

## 2015-07-15 ENCOUNTER — Telehealth: Payer: Self-pay | Admitting: Gastroenterology

## 2015-07-15 NOTE — Telephone Encounter (Signed)
Patient prefers to do this in August or September. The schedules are not available yet. Patient will wait for a call to schedule once it is available.

## 2015-09-07 ENCOUNTER — Telehealth: Payer: Self-pay | Admitting: Gastroenterology

## 2015-09-08 NOTE — Telephone Encounter (Signed)
Yes we can direct book for EGD, she does not need an office visit. Thanks

## 2015-09-08 NOTE — Telephone Encounter (Signed)
Patient needs to be scheduled for  Direct EGD with mapping per Dr. Havery Moros. Does not need OV.

## 2015-09-08 NOTE — Telephone Encounter (Signed)
Patient is calling and asking to schedule gastric mapping. Please, advise if she needs an OV or direct [facility].

## 2015-09-10 ENCOUNTER — Encounter: Payer: Self-pay | Admitting: Gastroenterology

## 2015-10-08 ENCOUNTER — Ambulatory Visit (AMBULATORY_SURGERY_CENTER): Payer: Self-pay

## 2015-10-08 VITALS — Ht 64.5 in | Wt 164.2 lb

## 2015-10-08 DIAGNOSIS — K2940 Chronic atrophic gastritis without bleeding: Secondary | ICD-10-CM

## 2015-10-08 NOTE — Progress Notes (Signed)
No allergies to eggs or soy No past problems with anesthesia No diet meds No home oxygen  Has email and internet; declined emmi 

## 2015-10-14 ENCOUNTER — Encounter: Payer: Self-pay | Admitting: Gastroenterology

## 2015-10-21 ENCOUNTER — Encounter: Payer: Self-pay | Admitting: Gastroenterology

## 2015-10-21 ENCOUNTER — Ambulatory Visit (AMBULATORY_SURGERY_CENTER): Payer: Managed Care, Other (non HMO) | Admitting: Gastroenterology

## 2015-10-21 VITALS — BP 100/66 | HR 51 | Temp 98.0°F | Resp 19 | Ht 64.0 in | Wt 164.0 lb

## 2015-10-21 DIAGNOSIS — K2940 Chronic atrophic gastritis without bleeding: Secondary | ICD-10-CM | POA: Diagnosis present

## 2015-10-21 MED ORDER — SODIUM CHLORIDE 0.9 % IV SOLN: 500.0000 mL | INTRAVENOUS | Status: DC

## 2015-10-21 NOTE — Patient Instructions (Signed)
YOU HAD AN ENDOSCOPIC PROCEDURE TODAY AT [facility]:   Refer to the procedure report that was given to you for any specific questions about what was found during the examination.  If the procedure report does not answer your questions, please call your gastroenterologist to clarify.  If you requested that your care partner not be given the details of your procedure findings, then the procedure report has been included in a sealed envelope for you to review at your convenience later.  YOU SHOULD EXPECT: Some feelings of bloating in the abdomen. Passage of more gas than usual.  Walking can help get rid of the air that was put into your GI tract during the procedure and reduce the bloating. If you had a lower endoscopy (such as a colonoscopy or flexible sigmoidoscopy) you may notice spotting of blood in your stool or on the toilet paper. If you underwent a bowel prep for your procedure, you may not have a normal bowel movement for a few days.  Please Note:  You might notice some irritation and congestion in your nose or some drainage.  This is from the oxygen used during your procedure.  There is no need for concern and it should clear up in a day or so.  SYMPTOMS TO REPORT IMMEDIATELY:   Following lower endoscopy (colonoscopy or flexible sigmoidoscopy):  Excessive amounts of blood in the stool  Significant tenderness or worsening of abdominal pains  Swelling of the abdomen that is new, acute  Fever of 100F or higher   Following upper endoscopy (EGD)  Vomiting of blood or coffee ground material  New chest pain or pain under the shoulder blades  Painful or persistently difficult swallowing  New shortness of breath  Fever of 100F or higher  Black, tarry-looking stools  For urgent or emergent issues, a gastroenterologist can be reached at any hour by calling [phone].   DIET:  We do recommend a small meal at first, but then you may proceed to your regular diet.  Drink  plenty of fluids but you should avoid alcoholic beverages for 24 hours.  ACTIVITY:  You should plan to take it easy for the rest of today and you should NOT DRIVE or use heavy machinery until tomorrow (because of the sedation medicines used during the test).    FOLLOW UP: Our staff will call the number listed on your records the next business day following your procedure to check on you and address any questions or concerns that you may have regarding the information given to you following your procedure. If we do not reach you, we will leave a message.  However, if you are feeling well and you are not experiencing any problems, there is no need to return our call.  We will assume that you have returned to your regular daily activities without incident.  If any biopsies were taken you will be contacted by phone or by letter within the next 1-3 weeks.  Please call us at [phone] if you have not heard about the biopsies in 3 weeks.    SIGNATURES/CONFIDENTIALITY: You and/or your care partner have signed paperwork which will be entered into your electronic medical record.  These signatures attest to the fact that that the information above on your After Visit Summary has been reviewed and is understood.  Full responsibility of the confidentiality of this discharge information lies with you and/or your care-partner.     You may resume your current medications today. Await  biopsy results. Please call if any questions or concerns.

## 2015-10-21 NOTE — Progress Notes (Signed)
No problems noted in the recovery room. maw 

## 2015-10-21 NOTE — Op Note (Signed)
[facility] Patient Name: Brittney Jones Procedure Date: 10/21/2015 10:38 AM MRN: [id number] Endoscopist: Remo Lipps P. Havery Moros , MD Age: 60 Referring MD:  Date of Birth: 11-19-1955 Gender: Female Account #: [id number] Procedure:                Upper GI endoscopy Indications:              Follow-up of atrophic gastritis, pernicious anemia,                            history of gastric intestinal metaplasia - here for                            mapping procedure Medicines:                Monitored Anesthesia Care Procedure:                Pre-Anesthesia Assessment:                           - Prior to the procedure, a History and Physical                            was performed, and patient medications and                            allergies were reviewed. The patient's tolerance of                            previous anesthesia was also reviewed. The risks                            and benefits of the procedure and the sedation                            options and risks were discussed with the patient.                            All questions were answered, and informed consent                            was obtained. Prior Anticoagulants: The patient has                            taken no previous anticoagulant or antiplatelet                            agents. ASA Grade Assessment: III - A patient with                            severe systemic disease. After reviewing the risks                            and benefits, the patient was deemed in  satisfactory condition to undergo the procedure.                           After obtaining informed consent, the endoscope was                            passed under direct vision. Throughout the                            procedure, the patient's blood pressure, pulse, and                            oxygen saturations were monitored continuously. The                            Model GIF-HQ190  [id number]) scope was introduced                            through the mouth, and advanced to the second part                            of duodenum. The upper GI endoscopy was                            accomplished without difficulty. The patient                            tolerated the procedure well. Scope In: Scope Out: Findings:                 Esophagogastric landmarks were identified: the                            Z-line was found at 36 cm, the gastroesophageal                            junction was found at 36 cm and the upper extent of                            the gastric folds was found at 36 cm from the                            incisors.                           A single area of ectopic gastric mucosa was found                            in the upper third of the esophagus.                           The exam of the esophagus was otherwise normal.                           The  entire examined stomach was normal. Biopsies                            were taken with a cold forceps for histology in all                            portions per gastric mapping protocol.                           The duodenal bulb and second portion of the                            duodenum were normal. Complications:            No immediate complications. Estimated blood loss:                            Minimal. Estimated Blood Loss:     Estimated blood loss was minimal. Impression:               - Esophagogastric landmarks identified.                           - Ectopic gastric mucosa in the upper third of the                            esophagus.                           - Normal stomach. Biopsied for gastric mapping,                            rule out dysplasia.                           - Normal duodenal bulb and second portion of the                            duodenum. Recommendation:           - Patient has a contact number available for                            emergencies.  The signs and symptoms of potential                            delayed complications were discussed with the                            patient. Return to normal activities tomorrow.                            Written discharge instructions were provided to the                            patient.                           -  Resume previous diet.                           - Continue present medications.                           - Await pathology results.                           - Repeat upper endoscopy for surveillance based on                            pathology results. Remo Lipps P. Sargon Scouten, MD 10/21/2015 10:57:16 AM This report has been signed electronically.

## 2015-10-21 NOTE — Progress Notes (Signed)
Called to room to assist during endoscopic procedure.  Patient ID and intended procedure confirmed with present staff. Received instructions for my participation in the procedure from the performing physician.  

## 2015-10-21 NOTE — Progress Notes (Signed)
To pacu vss patent aw report to rn 

## 2015-10-22 ENCOUNTER — Telehealth: Payer: Self-pay

## 2015-10-22 NOTE — Telephone Encounter (Signed)
  Follow up Call-  Call back number 10/21/2015 12/25/2014  Post procedure Call Back phone  # [phone]  Permission to leave phone message Yes Yes  Some recent data might be hidden     Patient questions:  Do you have a fever, pain , or abdominal swelling? No. Pain Score  0 *  Have you tolerated food without any problems? Yes.    Have you been able to return to your normal activities? Yes.    Do you have any questions about your discharge instructions: Diet   No. Medications  No. Follow up visit  No.  Do you have questions or concerns about your Care? No.  Actions: * If pain score is 4 or above: No action needed, pain <4.

## 2015-10-29 ENCOUNTER — Encounter: Payer: Self-pay | Admitting: Gastroenterology

## 2015-11-02 ENCOUNTER — Encounter: Payer: Self-pay | Admitting: Gastroenterology

## 2015-11-03 ENCOUNTER — Encounter: Payer: Self-pay | Admitting: Gastroenterology

## 2016-10-31 ENCOUNTER — Encounter: Payer: Self-pay | Admitting: Osteopathic Medicine

## 2016-10-31 ENCOUNTER — Ambulatory Visit (INDEPENDENT_AMBULATORY_CARE_PROVIDER_SITE_OTHER): Payer: Managed Care, Other (non HMO) | Admitting: Osteopathic Medicine

## 2016-10-31 VITALS — BP 125/76 | HR 69 | Ht 65.0 in | Wt 171.0 lb

## 2016-10-31 DIAGNOSIS — Z0189 Encounter for other specified special examinations: Secondary | ICD-10-CM

## 2016-10-31 DIAGNOSIS — K921 Melena: Secondary | ICD-10-CM

## 2016-10-31 DIAGNOSIS — Z860100 Personal history of colon polyps, unspecified: Secondary | ICD-10-CM | POA: Insufficient documentation

## 2016-10-31 DIAGNOSIS — E785 Hyperlipidemia, unspecified: Secondary | ICD-10-CM | POA: Insufficient documentation

## 2016-10-31 DIAGNOSIS — D510 Vitamin B12 deficiency anemia due to intrinsic factor deficiency: Secondary | ICD-10-CM | POA: Diagnosis not present

## 2016-10-31 DIAGNOSIS — L219 Seborrheic dermatitis, unspecified: Secondary | ICD-10-CM | POA: Diagnosis not present

## 2016-10-31 DIAGNOSIS — R922 Inconclusive mammogram: Secondary | ICD-10-CM

## 2016-10-31 DIAGNOSIS — D509 Iron deficiency anemia, unspecified: Secondary | ICD-10-CM | POA: Diagnosis not present

## 2016-10-31 DIAGNOSIS — M3500 Sicca syndrome, unspecified: Secondary | ICD-10-CM

## 2016-10-31 DIAGNOSIS — E039 Hypothyroidism, unspecified: Secondary | ICD-10-CM

## 2016-10-31 DIAGNOSIS — E559 Vitamin D deficiency, unspecified: Secondary | ICD-10-CM | POA: Diagnosis not present

## 2016-10-31 DIAGNOSIS — R768 Other specified abnormal immunological findings in serum: Secondary | ICD-10-CM

## 2016-10-31 DIAGNOSIS — E063 Autoimmune thyroiditis: Secondary | ICD-10-CM

## 2016-10-31 DIAGNOSIS — E782 Mixed hyperlipidemia: Secondary | ICD-10-CM | POA: Diagnosis not present

## 2016-10-31 NOTE — Progress Notes (Signed)
HPI: Brittney Jones is a 61 y.o. female  who presents to [name] today, 10/31/16,  for chief complaint of:  Chief Complaint  Patient presents with  . Establish Care    ANNUAL     New patient here to establish care. Previously seeing integrative medicine specialist - sounds like this person is on indefinite sabbatical though is working through [employer]. She is requesting new PCP for general care and to get labs for her integrative doctor to review and manage meds.   History of hypothyroid: meds as below.   Sjogren's and other non-severe autoimmune conditions, mild Lupus per patient, (+)ANA. No symptoms severe enough to warrant DMARD or Rheum referral, she will keep this in mind.   GI: Hc chronic bloody stool (BRB and dark stool), following with GI  Hx dense beast tissue: has undergone elective breast thermography which showed stable findings c/w anatomic variant.   Scalp: hx seborrheic dermatitis, severe. Pt wears scarf to visit today.    Past medical, surgical, social and family history reviewed: Patient Active Problem List   Diagnosis Date Noted  . Anemia 11/19/2014  . Infected sebaceous cyst 11/21/2011   Past Surgical History:  Procedure Laterality Date  . COLONOSCOPY  2010   in [place]  . ESOPHAGOGASTRODUODENOSCOPY  2010   in [place]  . lip biospy    . scalp biospy     Social History  Substance Use Topics  . Smoking status: Never Smoker  . Smokeless tobacco: Never Used  . Alcohol use 0.0 oz/week   Family History  Problem Relation Age of Onset  . Breast cancer Unknown        mat great aunt  . Colon polyps Mother   . Heart disease Mother   . Colon polyps Father   . Heart disease Father   . Colon polyps Brother   . Colon polyps Sister   . Clotting disorder Maternal Uncle   . Diabetes Paternal Aunt   . Stroke Maternal Uncle   . Liver disease Unknown        multiple people on mom's side  . Kidney cancer Paternal Grandmother   .  Leukemia Maternal Grandmother   . Lung cancer Maternal Uncle   . Stroke Maternal Grandfather   . Colon cancer Neg Hx      Current medication list and allergy/intolerance information reviewed:   Current Outpatient Prescriptions  Medication Sig Dispense Refill  . BETAMETHASONE DIPROPIONATE EX Apply topically.    . Cholecalciferol (VITAMIN D3) 10000 units capsule Take 10,000 Units by mouth daily.    . Choline & Mag Salicylates 4696 MG TABS Take by mouth daily.    Marland Kitchen DHEA 10 MG TABS Take 10 mg by mouth daily.    . Digestive Enzymes (DIGESTIVE ENZYME PO) Take by mouth daily.    . Ferrous Fumarate-Vitamin C (FERRO-SEQUELS PO) Take by mouth.    . levothyroxine (SYNTHROID, LEVOTHROID) 50 MCG tablet Take 50 mcg by mouth daily before breakfast.    . liothyronine (CYTOMEL) 25 MCG tablet Take 25 mcg by mouth daily.    . Magnesium 400 MG CAPS Take 400 mg by mouth daily.    . Methylcobalamin (B12-ACTIVE PO) Take 5,000 mg by mouth daily.     . Misc Natural Products (COLON CLEANSE PO) Take by mouth. 3 times a year    . Multiple Vitamins-Minerals (MULTIVITAMIN ADULT PO) Take by mouth daily.    . NON FORMULARY Take 1 packet by mouth daily.    Marland Kitchen  Omega 3 1200 MG CAPS Take by mouth daily.    Marland Kitchen OVER THE COUNTER MEDICATION Arthrosan10% APPLY TO AFFECTED AREA PRN    . OVER THE COUNTER MEDICATION Meriva bid    . Probiotic Product (PROBIOTIC ADVANCED PO) Take by mouth daily.    . Progesterone Wettable POWD by Misc.(Non-Drug; Combo Route) route.    . TESTOSTERONE BU Place inside cheek.     Current Facility-Administered Medications  Medication Dose Route Frequency Provider Last Rate Last Dose  . 0.9 %  sodium chloride infusion  500 mL Intravenous Continuous Armbruster, Carlota Raspberry, MD       No Known Allergies    Review of Systems:  Constitutional:  No  fever, no chills, No recent illness, No unintentional weight changes. No significant fatigue.   HEENT: No  headache, no vision change, no hearing change, No  sore throat, No  sinus pressure  Cardiac: No  chest pain, No  pressure, No palpitations, No  Orthopnea  Respiratory:  No  shortness of breath. No  Cough  Gastrointestinal: No  abdominal pain, No  nausea, No  vomiting,  +chronic blood in stool, No  diarrhea, No  constipation   Musculoskeletal: No new myalgia/arthralgia  Genitourinary: No  incontinence, No  abnormal genital bleeding, No abnormal genital discharge  Skin: No  Rash, No other wounds/concerning lesions  Hem/Onc: No  easy bruising/bleeding, No  abnormal lymph node  Endocrine: No cold intolerance,  No heat intolerance. No polyuria/polydipsia/polyphagia   Neurologic: No  weakness, No  dizziness, No  slurred speech/focal weakness/facial droop  Psychiatric: No  concerns with depression, No  concerns with anxiety, No sleep problems, No mood problems  Exam:  BP 125/76   Pulse 69   Ht 5\' 5"  (1.651 m)   Wt 171 lb (77.6 kg)   BMI 28.46 kg/m   Constitutional: VS see above. General Appearance: alert, well-developed, well-nourished, NAD  Eyes: Normal lids and conjunctive, non-icteric sclera  Ears, Nose, Mouth, Throat: MMM, Normal external inspection ears/nares/mouth/lips/gums. T  Neck: No masses, trachea midline. No thyroid enlargement.   Respiratory: Normal respiratory effort. no wheeze, no rhonchi, no rales  Cardiovascular: S1/S2 normal, no murmur, no rub/gallop auscultated. RRR. No lower extremity edema.  Musculoskeletal: Gait normal.   Neurological: Normal balance/coordination. ct and symmetric. Cerebellar reflexes intact.   Skin: warm, dry, intact.   Psychiatric: Normal judgment/insight. Normal mood and affect. Oriented x3.     ASSESSMENT/PLAN:   Hashimoto's thyroiditis - Plan: TSH, T4, free, T3, reverse, T3, free, Lipid panel, Thyroid peroxidase antibody, Thyroglobulin antibody  History of colon polyps - Advised continue f/u w/ GI  Blood present in stool - advised continue followup with GI  Seborrheic  dermatitis of scalp - ok to refill meds  Hypothyroidism, unspecified type - Plan: TSH, T4, free, T3, reverse, T3, free, Thyroid peroxidase antibody, Thyroglobulin antibody, COMPLETE METABOLIC PANEL WITH GFR, CBC  Sjogren's syndrome, with unspecified organ involvement (HCC)  Elevated antinuclear antibody (ANA) level  Mixed hyperlipidemia - Plan: Lipid panel  Iron deficiency anemia, unspecified iron deficiency anemia type - Plan: CBC, Fe+TIBC+Fer  Pernicious anemia - Plan: CBC  Patient request for diagnostic testing - Plan: Plasma coenzyme q10, blood, Progesterone, Estradiol, Estrogens, total, Testosterone, DHEA-sulfate, Magnesium, Homocysteine, Hemoglobin A1c, Fe+TIBC+Fer, Urinalysis, Routine w reflex microscopic  Vitamin D deficiency - Plan: VITAMIN D 25 Hydroxy (Vit-D Deficiency, Fractures)  Dense breast tissue on mammogram - Previous Birads 2, pt has some questions about screening MRI. Will review records and address at annual wellness  Visit summary with medication list and pertinent instructions was printed for patient to review. All questions at time of visit were answered - patient instructed to contact office with any additional concerns. ER/RTC precautions were reviewed with the patient. Follow-up plan: Return in about 4 weeks (around 11/28/2016) for [place], sooner if needed .  Note: Total time spent 45 minutes, greater than 50% of the visit was spent face-to-face counseling and coordinating care for the following: The primary encounter diagnosis was Hashimoto's thyroiditis. Diagnoses of History of colon polyps, Blood present in stool, Seborrheic dermatitis of scalp, Hypothyroidism, unspecified type, Sjogren's syndrome, with unspecified organ involvement ([place]), Elevated antinuclear antibody (ANA) level, Mixed hyperlipidemia, Iron deficiency anemia, unspecified iron deficiency anemia type, Pernicious anemia, Patient request for diagnostic testing, Vitamin D deficiency,  and Dense breast tissue on mammogram were also pertinent to this visit.Marland Kitchen

## 2016-11-04 LAB — T3, FREE: T3, Free: 4.7 pg/mL — ABNORMAL HIGH (ref 2.3–4.2)

## 2016-11-04 LAB — COMPLETE METABOLIC PANEL WITH GFR
AG RATIO: 1.8 (calc) (ref 1.0–2.5)
ALBUMIN MSPROF: 4.2 g/dL (ref 3.6–5.1)
ALT: 30 U/L — AB (ref 6–29)
AST: 18 U/L (ref 10–35)
Alkaline phosphatase (APISO): 54 U/L (ref 33–130)
BUN: 16 mg/dL (ref 7–25)
CALCIUM: 9.5 mg/dL (ref 8.6–10.4)
CHLORIDE: 105 mmol/L (ref 98–110)
CO2: 27 mmol/L (ref 20–32)
Creat: 0.81 mg/dL (ref 0.50–0.99)
GFR, EST AFRICAN AMERICAN: 90 mL/min/{1.73_m2} (ref >=60)
GFR, EST NON AFRICAN AMERICAN: 78 mL/min/{1.73_m2} (ref >=60)
Globulin: 2.3 g/dL (calc) (ref 1.9–3.7)
Glucose, Bld: 96 mg/dL (ref 65–99)
Potassium: 4.3 mmol/L (ref 3.5–5.3)
Sodium: 140 mmol/L (ref 135–146)
TOTAL PROTEIN: 6.5 g/dL (ref 6.1–8.1)
Total Bilirubin: 0.4 mg/dL (ref 0.2–1.2)

## 2016-11-04 LAB — URINALYSIS, ROUTINE W REFLEX MICROSCOPIC
Bilirubin Urine: NEGATIVE
GLUCOSE, UA: NEGATIVE
HGB URINE DIPSTICK: NEGATIVE
Ketones, ur: NEGATIVE
LEUKOCYTES UA: NEGATIVE
Nitrite: NEGATIVE
PH: 7.5 (ref 5.0–8.0)
Protein, ur: NEGATIVE
Specific Gravity, Urine: 1.009 (ref 1.001–1.03)

## 2016-11-04 LAB — CBC
HCT: 38 % (ref 35.0–45.0)
Hemoglobin: 12.7 g/dL (ref 11.7–15.5)
MCH: 29.6 pg (ref 27.0–33.0)
MCHC: 33.4 g/dL (ref 32.0–36.0)
MCV: 88.6 fL (ref 80.0–100.0)
MPV: 10.3 fL (ref 7.5–12.5)
PLATELETS: 261 10*3/uL (ref 140–400)
RBC: 4.29 10*6/uL (ref 3.80–5.10)
RDW: 12.3 % (ref 11.0–15.0)
WBC: 4.8 10*3/uL (ref 3.8–10.8)

## 2016-11-04 LAB — PROGESTERONE: Progesterone: 54.7 ng/mL

## 2016-11-04 LAB — PLASMA COENZYME Q10, BLOOD: Plasma CoEnzyme Q10: 1.7 mg/L — ABNORMAL HIGH (ref 0.44–1.64)

## 2016-11-04 LAB — LIPID PANEL
CHOLESTEROL: 272 mg/dL — AB (ref <200)
HDL: 55 mg/dL (ref >50)
LDL Cholesterol (Calc): 184 mg/dL (calc) — ABNORMAL HIGH
NON-HDL CHOLESTEROL (CALC): 217 mg/dL — AB (ref <130)
TRIGLYCERIDES: 173 mg/dL — AB (ref <150)
Total CHOL/HDL Ratio: 4.9 (calc) (ref <5.0)

## 2016-11-04 LAB — HEMOGLOBIN A1C
EAG (MMOL/L): 6.3 (calc)
HEMOGLOBIN A1C: 5.6 %{Hb} (ref <5.7)
Mean Plasma Glucose: 114 (calc)

## 2016-11-04 LAB — IRON,TIBC AND FERRITIN PANEL
%SAT: 24 % (calc) (ref 11–50)
FERRITIN: 63 ng/mL (ref 20–288)
Iron: 74 ug/dL (ref 45–160)
TIBC: 314 ug/dL (ref 250–450)

## 2016-11-04 LAB — DHEA-SULFATE: DHEA-SO4: 170 ug/dL — ABNORMAL HIGH (ref 12–133)

## 2016-11-04 LAB — TSH: TSH: 0.81 m[IU]/L (ref 0.40–4.50)

## 2016-11-04 LAB — THYROID PEROXIDASE ANTIBODY: Thyroperoxidase Ab SerPl-aCnc: 134 IU/mL — ABNORMAL HIGH (ref <9)

## 2016-11-04 LAB — ESTROGENS, TOTAL: Estrogen: 110.2 pg/mL

## 2016-11-04 LAB — ESTRADIOL: Estradiol: 15 pg/mL

## 2016-11-04 LAB — THYROGLOBULIN ANTIBODY: THYROGLOBULIN AB: 1 [IU]/mL (ref <=1)

## 2016-11-04 LAB — T4, FREE: FREE T4: 0.7 ng/dL — AB (ref 0.8–1.8)

## 2016-11-04 LAB — VITAMIN D 25 HYDROXY (VIT D DEFICIENCY, FRACTURES): VIT D 25 HYDROXY: 45 ng/mL (ref 30–100)

## 2016-11-04 LAB — MAGNESIUM: MAGNESIUM: 2 mg/dL (ref 1.5–2.5)

## 2016-11-04 LAB — T3, REVERSE: T3 REVERSE: 8 ng/dL (ref 8–25)

## 2016-11-04 LAB — HOMOCYSTEINE: HOMOCYSTEINE: 7.3 umol/L (ref <10.4)

## 2016-11-04 LAB — TESTOSTERONE, TOTAL, LC/MS/MS: TESTOSTERONE, TOTAL, LC-MS-MS: 13 ng/dL (ref 2–45)

## 2016-11-09 ENCOUNTER — Ambulatory Visit: Payer: Managed Care, Other (non HMO) | Admitting: Osteopathic Medicine

## 2016-12-01 ENCOUNTER — Encounter: Payer: Self-pay | Admitting: Osteopathic Medicine

## 2016-12-01 ENCOUNTER — Ambulatory Visit (INDEPENDENT_AMBULATORY_CARE_PROVIDER_SITE_OTHER): Payer: Managed Care, Other (non HMO) | Admitting: Osteopathic Medicine

## 2016-12-01 ENCOUNTER — Other Ambulatory Visit (HOSPITAL_COMMUNITY)
Admission: RE | Admit: 2016-12-01 | Discharge: 2016-12-01 | Disposition: A | Discharge status: Home / Self Care | Payer: Managed Care, Other (non HMO) | Source: Ambulatory Visit | Attending: Osteopathic Medicine | Admitting: Osteopathic Medicine

## 2016-12-01 VITALS — BP 121/71 | HR 77 | Ht 64.5 in | Wt 162.0 lb

## 2016-12-01 DIAGNOSIS — Z124 Encounter for screening for malignant neoplasm of cervix: Secondary | ICD-10-CM

## 2016-12-01 DIAGNOSIS — Z1231 Encounter for screening mammogram for malignant neoplasm of breast: Secondary | ICD-10-CM | POA: Diagnosis not present

## 2016-12-01 DIAGNOSIS — E278 Other specified disorders of adrenal gland: Secondary | ICD-10-CM | POA: Diagnosis not present

## 2016-12-01 DIAGNOSIS — Z23 Encounter for immunization: Secondary | ICD-10-CM | POA: Diagnosis not present

## 2016-12-01 DIAGNOSIS — R7989 Other specified abnormal findings of blood chemistry: Secondary | ICD-10-CM | POA: Insufficient documentation

## 2016-12-01 DIAGNOSIS — Z0000 Encounter for general adult medical examination without abnormal findings: Secondary | ICD-10-CM | POA: Diagnosis not present

## 2016-12-01 DIAGNOSIS — Z1239 Encounter for other screening for malignant neoplasm of breast: Secondary | ICD-10-CM | POA: Insufficient documentation

## 2016-12-01 MED ORDER — ZOSTER VAC RECOMB ADJUVANTED 50 MCG/0.5ML IM SUSR: 0.5000 mL | Freq: Once | INTRAMUSCULAR | 1 refills | Status: AC

## 2016-12-01 NOTE — Patient Instructions (Addendum)
Will look into MRI of breast tissue for screening - I may need to speak to someone in Radiology. Or you can call [facility] at [phone] - this is the number on your most recent mammogram report.   See below for information on Tetanus booster and Shingles vaccination. I've printed a Rx for shingles vaccine - your pharmacist may be better able to answer the question on its aluminum contents, if it has this. They can also administer the vaccine if you desire.    Td Vaccine (Tetanus and Diphtheria): What You Need to Know 1. Why get vaccinated? Tetanus  and diphtheria are very serious diseases. They are rare in the [place] today, but people who do become infected often have severe complications. Td vaccine is used to protect adolescents and adults from both of these diseases. Both tetanus and diphtheria are infections caused by bacteria. Diphtheria spreads from person to person through coughing or sneezing. Tetanus-causing bacteria enter the body through cuts, scratches, or wounds. TETANUS (lockjaw) causes painful muscle tightening and stiffness, usually all over the body.  It can lead to tightening of muscles in the head and neck so you can't open your mouth, swallow, or sometimes even breathe. Tetanus kills about 1 out of every 10 people who are infected even after receiving the best medical care.  DIPHTHERIA can cause a thick coating to form in the back of the throat.  It can lead to breathing problems, paralysis, heart failure, and death.  Before vaccines, as many as 200,000 cases of diphtheria and hundreds of cases of tetanus were reported in the [place] each year. Since vaccination began, reports of cases for both diseases have dropped by about 99%. 2. Td vaccine Td vaccine can protect adolescents and adults from tetanus and diphtheria. Td is usually given as a booster dose every 10 years but it can also be given earlier after a severe and dirty wound or burn. Another vaccine,  called Tdap, which protects against pertussis in addition to tetanus and diphtheria, is sometimes recommended instead of Td vaccine. Your doctor or the person giving you the vaccine can give you more information. Td may safely be given at the same time as other vaccines. 3. Some people should not get this vaccine  A person who has ever had a life-threatening allergic reaction after a previous dose of any tetanus or diphtheria containing vaccine, OR has a severe allergy to any part of this vaccine, should not get Td vaccine. Tell the person giving the vaccine about any severe allergies.  Talk to your doctor if you: ? had severe pain or swelling after any vaccine containing diphtheria or tetanus, ? ever had a condition called Guillain Barre Syndrome (GBS), ? aren't feeling well on the day the shot is scheduled. 4. What are the risks from Td vaccine? With any medicine, including vaccines, there is a chance of side effects. These are usually mild and go away on their own. Serious reactions are also possible but are rare. Most people who get Td vaccine do not have any problems with it. Mild problems following Td vaccine: (Did not interfere with activities)  Pain where the shot was given (about 8 people in 10)  Redness or swelling where the shot was given (about 1 person in 4)  Mild fever (rare)  Headache (about 1 person in 4)  Tiredness (about 1 person in 4)  Moderate problems following Td vaccine: (Interfered with activities, but did not require medical attention)  Fever over  102F (rare)  Severe problems following Td vaccine: (Unable to perform usual activities; required medical attention)  Swelling, severe pain, bleeding and/or redness in the arm where the shot was given (rare).  Problems that could happen after any vaccine:  People sometimes faint after a medical procedure, including vaccination. Sitting or lying down for about 15 minutes can help prevent fainting, and injuries  caused by a fall. Tell your doctor if you feel dizzy, or have vision changes or ringing in the ears.  Some people get severe pain in the shoulder and have difficulty moving the arm where a shot was given. This happens very rarely.  Any medication can cause a severe allergic reaction. Such reactions from a vaccine are very rare, estimated at fewer than 1 in a million doses, and would happen within a few minutes to a few hours after the vaccination. As with any medicine, there is a very remote chance of a vaccine causing a serious injury or death. The safety of vaccines is always being monitored. For more information, visit: http://www.aguilar.org/ 5. What if there is a serious reaction? What should I look for? Look for anything that concerns you, such as signs of a severe allergic reaction, very high fever, or unusual behavior. Signs of a severe allergic reaction can include hives, swelling of the face and throat, difficulty breathing, a fast heartbeat, dizziness, and weakness. These would usually start a few minutes to a few hours after the vaccination. What should I do?  If you think it is a severe allergic reaction or other emergency that can't wait, call 9-1-1 or get the person to the nearest hospital. Otherwise, call your doctor.  Afterward, the reaction should be reported to the Vaccine Adverse Event Reporting System (VAERS). Your doctor might file this report, or you can do it yourself through the VAERS web site at www.vaers.SamedayNews.es, or by calling 779-309-0414. ? VAERS does not give medical advice. 6. The National Vaccine Injury Compensation Program The [employer] Vaccine Injury Compensation Program (VICP) is a federal program that was created to compensate people who may have been injured by certain vaccines. Persons who believe they may have been injured by a vaccine can learn about the program and about filing a claim by calling (859)348-1126 or visiting the [place] website at  GoldCloset.com.ee. There is a time limit to file a claim for compensation. 7. How can I learn more?  Ask your doctor. He or she can give you the vaccine package insert or suggest other sources of information.  Call your local or state health department.  Contact the Centers for Disease Control and Prevention (CDC): ? Call 872-505-0811 (1-800-CDC-INFO) ? Visit CDC's website at http://hunter.com/ CDC Td Vaccine VIS (06/02/15) This information is not intended to replace advice given to you by your health care provider. Make sure you discuss any questions you have with your health care provider. Document Released: 12/05/2005 Document Revised: 10/29/2015 Document Reviewed: 10/29/2015 Elsevier Interactive Patient Education  2017 Elsevier Inc.   Recombinant Zoster (Shingles) Vaccine, RZV: What You Need to Know 1. Why get vaccinated? Shingles (also called herpes zoster, or just zoster) is a painful skin rash, often with blisters. Shingles is caused by the varicella zoster virus, the same virus that causes chickenpox. After you have chickenpox, the virus stays in your body and can cause shingles later in life. You can't catch shingles from another person. However, a person who has never had chickenpox (or chickenpox vaccine) could get chickenpox from someone with shingles. A shingles  rash usually appears on one side of the face or body and heals within 2 to 4 weeks. Its main symptom is pain, which can be severe. Other symptoms can include fever, headache, chills and upset stomach. Very rarely, a shingles infection can lead to pneumonia, hearing problems, blindness, brain inflammation (encephalitis), or death. For about 1 person in 5, severe pain can continue even long after the rash has cleared up. This long-lasting pain is called post-herpetic neuralgia (PHN). Shingles is far more common in people 1 years of age and older than in younger people, and the risk increases with age. It  is also more common in people whose immune system is weakened because of a disease such as cancer, or by drugs such as steroids or chemotherapy. At least 1 million people a year in the [place] get shingles. 2. Shingles vaccine (recombinant) Recombinant shingles vaccine was approved by FDA in 2017 for the prevention of shingles. In clinical trials, it was more than 90% effective in preventing shingles. It can also reduce the likelihood of PHN. Two doses, 2 to 6 months apart, are recommended for adults 44 and older. This vaccine is also recommended for people who have already gotten the live shingles vaccine (Zostavax). There is no live virus in this vaccine. 3. Some people should not get this vaccine Tell your vaccine provider if you:  Have any severe, life-threatening allergies. A person who has ever had a life-threatening allergic reaction after a dose of recombinant shingles vaccine, or has a severe allergy to any component of this vaccine, may be advised not to be vaccinated. Ask your health care provider if you want information about vaccine components.  Are pregnant or breastfeeding. There is not much information about use of recombinant shingles vaccine in pregnant or nursing women. Your healthcare provider might recommend delaying vaccination.  Are not feeling well. If you have a mild illness, such as a cold, you can probably get the vaccine today. If you are moderately or severely ill, you should probably wait until you recover. Your doctor can advise you.  4. Risks of a vaccine reaction With any medicine, including vaccines, there is a chance of reactions. After recombinant shingles vaccination, a person might experience:  Pain, redness, soreness, or swelling at the site of the injection  Headache, muscle aches, fever, shivering, fatigue  In clinical trials, most people got a sore arm with mild or moderate pain after vaccination, and some also had redness and swelling where they  got the shot. Some people felt tired, had muscle pain, a headache, shivering, fever, stomach pain, or nausea. About 1 out of 6 people who got recombinant zoster vaccine experienced side effects that prevented them from doing regular activities. Symptoms went away on their own in about 2 to 3 days. Side effects were more common in younger people. You should still get the second dose of recombinant zoster vaccine even if you had one of these reactions after the first dose. Other things that could happen after this vaccine:  People sometimes faint after medical procedures, including vaccination. Sitting or lying down for about 15 minutes can help prevent fainting and injuries caused by a fall. Tell your provider if you feel dizzy or have vision changes or ringing in the ears.  Some people get shoulder pain that can be more severe and longer-lasting than routine soreness that can follow injections. This happens very rarely.  Any medication can cause a severe allergic reaction. Such reactions to a vaccine are  estimated at about 1 in a million doses, and would happen within a few minutes to a few hours after the vaccination. As with any medicine, there is a very remote chance of a vaccine causing a serious injury or death. The safety of vaccines is always being monitored. For more information, visit: http://www.aguilar.org/ 5. What if there is a serious problem? What should I look for?  Look for anything that concerns you, such as signs of a severe allergic reaction, very high fever, or unusual behavior. Signs of a severe allergic reaction can include hives, swelling of the face and throat, difficulty breathing, a fast heartbeat, dizziness, and weakness. These would usually start a few minutes to a few hours after the vaccination. What should I do?  If you think it is a severe allergic reaction or other emergency that can't wait, call 9-1-1 and get to the nearest hospital. Otherwise, call your health  care provider. Afterward, the reaction should be reported to the Vaccine Adverse Event Reporting System (VAERS). Your doctor should file this report, or you can do it yourself through the VAERS web site atwww.vaers.https://www.bray.com/ by calling 409-720-1238. VAERS does not give medical advice. 6. How can I learn more?  Ask your healthcare provider. He or she can give you the vaccine package insert or suggest other sources of information.  Call your local or state health department.  Contact the Centers for Disease Control and Prevention (CDC): ? Call (254)787-3205 (1-800-CDC-INFO) or ? Visit the CDC's website at http://hunter.com/ CDC Vaccine Information Statement (VIS) Recombinant Zoster Vaccine (04/04/2016) This information is not intended to replace advice given to you by your health care provider. Make sure you discuss any questions you have with your health care provider. Document Released: 04/19/2016 Document Revised: 04/19/2016 Document Reviewed: 04/19/2016 Elsevier Interactive Patient Education  [employer].

## 2016-12-01 NOTE — Progress Notes (Signed)
HPI: Brittney Jones is a 61 y.o. female  who presents to [facility] today, 12/01/16,  for chief complaint of:  Chief Complaint  Patient presents with  . Annual Exam     At last visit, patient requested certain labs for the information of her integrative health provider who will be managing many of her supplements/medications. These labs were ordered as a courtesy - results were reviewed with patient today regarding some concerns I had: significantly elevated cholesterol. TSH was normal though other requested thyroid labs showed some slight out of range as far as free T4, thyroid peroxidase antibody which is expected to be elevated with autoimmune thyroid disorder. DHEA was elevated which can be consistent with adrenal issues versus supplementation   See below for review of precentive care   Past medical history, surgical history, social history and family history reviewed.  Patient Active Problem List   Diagnosis Date Noted  . Hashimoto's thyroiditis 10/31/2016  . Hyperlipidemia 10/31/2016  . Iron deficiency anemia 10/31/2016  . Pernicious anemia 10/31/2016  . Vitamin D deficiency 10/31/2016  . History of colon polyps 10/31/2016  . Blood present in stool 10/31/2016  . Seborrheic dermatitis of scalp 10/31/2016  . Hypothyroid 10/31/2016  . Sjogren's syndrome ([place]) 10/31/2016  . Elevated antinuclear antibody (ANA) level 10/31/2016  . Dense breast tissue on mammogram 10/31/2016    Current medication list and allergy/intolerance information reviewed.   Current Outpatient Prescriptions on File Prior to Visit  Medication Sig Dispense Refill  . BETAMETHASONE DIPROPIONATE EX Apply topically.    . Cholecalciferol (VITAMIN D3) 10000 units capsule Take 10,000 Units by mouth daily.    . Choline & Mag Salicylates 8338 MG TABS Take by mouth daily.    Marland Kitchen DHEA 10 MG TABS Take 10 mg by mouth daily.    . Digestive Enzymes (DIGESTIVE ENZYME PO) Take by mouth daily.     . Ferrous Fumarate-Vitamin C (FERRO-SEQUELS PO) Take by mouth.    . IODINE, KELP, PO Take 12.5 mg by mouth daily.    Marland Kitchen levothyroxine (SYNTHROID, LEVOTHROID) 50 MCG tablet Take 50 mcg by mouth daily before breakfast.    . liothyronine (CYTOMEL) 25 MCG tablet Take 25 mcg by mouth daily.    . Magnesium 400 MG CAPS Take 400 mg by mouth daily.    . Methylcobalamin (B12-ACTIVE PO) Take 5,000 mg by mouth daily.     . Multiple Vitamins-Minerals (MULTIVITAMIN ADULT PO) Take by mouth daily.    . NON FORMULARY Take 1 packet by mouth daily.    . Omega 3 1200 MG CAPS Take by mouth daily.    Marland Kitchen OVER THE COUNTER MEDICATION Arthrosan10% APPLY TO AFFECTED AREA PRN    . OVER THE COUNTER MEDICATION Meriva bid    . Probiotic Product (PROBIOTIC ADVANCED PO) Take by mouth daily.    . Progesterone Wettable POWD by Misc.(Non-Drug; Combo Route) route.    . TESTOSTERONE BU Place inside cheek.     Current Facility-Administered Medications on File Prior to Visit  Medication Dose Route Frequency Provider Last Rate Last Dose  . 0.9 %  sodium chloride infusion  500 mL Intravenous Continuous Armbruster, Carlota Raspberry, MD       No Known Allergies    Review of Systems:  Constitutional: No recent illness  HEENT: No  headache, no vision change  Cardiac: No  chest pain, No  pressure, No palpitations  Respiratory:  No  shortness of breath. No  Cough  Gastrointestinal: No  abdominal pain, no change on bowel habits  Musculoskeletal: No new myalgia/arthralgia  Skin: No  Rash  Hem/Onc: No  easy bruising/bleeding, No  abnormal lumps/bumps  Neurologic: No  weakness, No  Dizziness  Psychiatric: No  concerns with depression, No  concerns with anxiety  Exam:  BP 121/71   Pulse 77   Ht 5' 4.5" (1.638 m)   Wt 162 lb (73.5 kg)   BMI 27.38 kg/m   Constitutional: VS see above. General Appearance: alert, well-developed, well-nourished, NAD  Eyes: Normal lids and conjunctive, non-icteric sclera  Ears, Nose, Mouth,  Throat: MMM, Normal external inspection ears/nares/mouth/lips/gums.  Neck: No masses, trachea midline.   Respiratory: Normal respiratory effort. no wheeze, no rhonchi, no rales  Cardiovascular: S1/S2 normal, no murmur, no rub/gallop auscultated. RRR.   Musculoskeletal: Gait normal. Symmetric and independent movement of all extremities  Neurological: Normal balance/coordination. No tremor.  Skin: warm, dry, intact.   Psychiatric: Normal judgment/insight. Normal mood and affect. Oriented x3.  GYN: No lesions/ulcers to external genitalia, normal urethra, normal vaginal mucosa, physiologic discharge, cervix normal without lesions, uterus not enlarged or tender, adnexa no masses and nontender  BREAST: No rashes/skin changes, normal fibrous breast tissue, no masses or tenderness, normal nipple without discharge, normal axilla    Recent Results (from the past 2160 hour(s))  TSH     Status: None   Collection Time: 10/31/16  9:42 AM  Result Value Ref Range   TSH 0.81 0.40 - 4.50 mIU/L  T4, free     Status: Abnormal   Collection Time: 10/31/16  9:42 AM  Result Value Ref Range   Free T4 0.7 (L) 0.8 - 1.8 ng/dL  T3, reverse     Status: None   Collection Time: 10/31/16  9:42 AM  Result Value Ref Range   T3, Reverse 8 8 - 25 ng/dL    Comment: . This test was developed and its analytical performance characteristics have been determined by [place], [place]. It has not been cleared or approved by the U.S. Food and Drug Administration. This assay has been validated pursuant to the CLIA regulations and is used for clinical purposes. .   T3, free     Status: Abnormal   Collection Time: 10/31/16  9:42 AM  Result Value Ref Range   T3, Free 4.7 (H) 2.3 - 4.2 pg/mL  VITAMIN D 25 Hydroxy (Vit-D Deficiency, Fractures)     Status: None   Collection Time: 10/31/16  9:42 AM  Result Value Ref Range   Vit D, 25-Hydroxy 45 30 - 100 ng/mL    Comment: Vitamin D  Status         25-OH Vitamin D: . Deficiency:                    <20 ng/mL Insufficiency:             20 - 29 ng/mL Optimal:                 > or = 30 ng/mL . For 25-OH Vitamin D testing on patients on  D2-supplementation and patients for whom quantitation  of D2 and D3 fractions is required, the QuestAssureD(TM) 25-OH VIT D, (D2,D3), LC/MS/MS is recommended: order  code [phone] (patients >66yrs). . For more information on this test, go to: http://education.questdiagnostics.com/faq/FAQ163 (This link is being provided for  informational/educational purposes only.)   Lipid panel     Status: Abnormal   Collection Time: 10/31/16  9:42 AM  Result Value Ref Range   Cholesterol 272 (H) <200 mg/dL   HDL 55 >50 mg/dL   Triglycerides 173 (H) <150 mg/dL   LDL Cholesterol (Calc) 184 (H) mg/dL (calc)    Comment: Reference range: <100 . Desirable range <100 mg/dL for primary prevention;   <70 mg/dL for patients with CHD or diabetic patients  with > or = 2 CHD risk factors. Marland Kitchen LDL-C is now calculated using the Martin-Hopkins  calculation, which is a validated novel method providing  better accuracy than the Friedewald equation in the  estimation of LDL-C.  Cresenciano Genre et al. Annamaria Helling. 4034;742(59): 2061-2068  (http://education.[facility].com/faq/FAQ164)    Total CHOL/HDL Ratio 4.9 <5.0 (calc)   Non-HDL Cholesterol (Calc) 217 (H) <130 mg/dL (calc)    Comment: For patients with diabetes plus 1 major ASCVD risk  factor, treating to a non-HDL-C goal of <100 mg/dL  (LDL-C of <70 mg/dL) is considered a therapeutic  option.   Plasma coenzyme q10, blood     Status: Abnormal   Collection Time: 10/31/16  9:42 AM  Result Value Ref Range   Plasma CoEnzyme Q10 1.70 (H) 0.44 - 1.64 mg/L    Comment: . This test was developed and its analytical performance characteristics have been determined by [employer], [place], [place]. It has not been cleared or approved by the FDA.  This assay has been validated pursuant to the CLIA regulations and is used for clinical purposes. .   Progesterone     Status: None   Collection Time: 10/31/16  9:42 AM  Result Value Ref Range   Progesterone 54.7 ng/mL    Comment:             Reference Ranges          Female             Follicular Phase     < 1.0             Luteal Phase      2.6-21.5             Post menopausal      < 0.5             Pregnancy             1st Trimester     4.1-34.0             2nd Trimester    24.0-76.0             3rd Trimester   52.0-302.0   Estradiol     Status: None   Collection Time: 10/31/16  9:42 AM  Result Value Ref Range   Estradiol 15 pg/mL    Comment:       Reference Range         Follicular Phase:    56-387         Mid-Cycle:           64-357         Luteal Phase:        56-214         Postmenopausal:      < or = 31 . Reference range established on post-pubertal patient population. No pre-pubertal reference range established using this assay. For any patients for whom low Estradiol levels are anticipated (e.g. males, pre-pubertal children and hypogonadal/post-menopausal  females), the [employer] Estradiol, Ultrasensitive, LCMSMS assay is recommended (order code [phone]). . Please note: patients being treated with the drug  fulvestrant (  Faslodex(R)) have demonstrated significant  interference in immunoassay methods for estradiol  measurement. The cross reactivity could lead to falsely  elevated estradiol test results leading to an  inappropriate clinical assessment of estrogen status. [facility] order code 30289-Estradiol,  Ultrasensitive LC/MS/MS demonstrates negligible cross  re activity with fulvestrant.   Estrogens, total     Status: None   Collection Time: 10/31/16  9:42 AM  Result Value Ref Range   Estrogen 110.2 pg/mL    Comment: . Reference Ranges for Total Estrogen: .   Follicular Phase        (1-12 days):  90-590 pg/mL    Luteal Phase:     130-460 pg/mL   Postmenopausal:    50-170 pg/mL . The total estrogen assay is not recommended for use in pre-pubertal children.   DHEA-sulfate     Status: Abnormal   Collection Time: 10/31/16  9:42 AM  Result Value Ref Range   DHEA-SO4 170 (H) 12 - 133 mcg/dL    Comment: . DHEA-S values fall with advancing age. For reference, the reference intervals for 33-64 year old patients are: Marland Kitchen Female:   712-458 mcg/dL Female:  23-266 mcg/dL .   Magnesium     Status: None   Collection Time: 10/31/16  9:42 AM  Result Value Ref Range   Magnesium 2.0 1.5 - 2.5 mg/dL  Thyroid peroxidase antibody     Status: Abnormal   Collection Time: 10/31/16  9:42 AM  Result Value Ref Range   Thyroperoxidase Ab SerPl-aCnc 134 (H) <9 IU/mL  Thyroglobulin antibody     Status: None   Collection Time: 10/31/16  9:42 AM  Result Value Ref Range   Thyroglobulin Ab 1 < or = 1 IU/mL  Homocysteine     Status: None   Collection Time: 10/31/16  9:42 AM  Result Value Ref Range   Homocysteine 7.3 <10.4 umol/L    Comment: Homocysteine is increased by functional deficiency of  folate or vitamin B12.  Testing for methylmalonic acid  differentiates between these deficiencies.  Other causes  of increased homocysteine include renal failure, folate  antagonists such as methotrexate and phenytoin, and  exposure to nitrous oxide.   COMPLETE METABOLIC PANEL WITH GFR     Status: Abnormal   Collection Time: 10/31/16  9:42 AM  Result Value Ref Range   Glucose, Bld 96 65 - 99 mg/dL    Comment: .            Fasting reference interval .    BUN 16 7 - 25 mg/dL   Creat 0.81 0.50 - 0.99 mg/dL    Comment: For patients >69 years of age, the reference limit for Creatinine is approximately 13% higher for people identified as African-American. .    GFR, Est Non African American 78 > OR = 60 mL/min/1.75m2   GFR, Est African American 90 > OR = 60 mL/min/1.95m2   BUN/Creatinine Ratio NOT APPLICABLE 6 - 22 (calc)    Sodium 140 135 - 146 mmol/L   Potassium 4.3 3.5 - 5.3 mmol/L   Chloride 105 98 - 110 mmol/L   CO2 27 20 - 32 mmol/L   Calcium 9.5 8.6 - 10.4 mg/dL   Total Protein 6.5 6.1 - 8.1 g/dL   Albumin 4.2 3.6 - 5.1 g/dL   Globulin 2.3 1.9 - 3.7 g/dL (calc)   AG Ratio 1.8 1.0 - 2.5 (calc)   Total Bilirubin 0.4 0.2 - 1.2 mg/dL   Alkaline phosphatase (APISO) 54 33 - 130  U/L   AST 18 10 - 35 U/L   ALT 30 (H) 6 - 29 U/L  Hemoglobin A1c     Status: None   Collection Time: 10/31/16  9:42 AM  Result Value Ref Range   Hgb A1c MFr Bld 5.6 <5.7 % of total Hgb    Comment: For the purpose of screening for the presence of diabetes: . <5.7%       Consistent with the absence of diabetes 5.7-6.4%    Consistent with increased risk for diabetes             (prediabetes) > or =6.5%  Consistent with diabetes . This assay result is consistent with a decreased risk of diabetes. . Currently, no consensus exists regarding use of hemoglobin A1c for diagnosis of diabetes in children. . According to American Diabetes Association (ADA) guidelines, hemoglobin A1c <7.0% represents optimal control in non-pregnant diabetic patients. Different metrics may apply to specific patient populations.  Standards of Medical Care in Diabetes(ADA). .    Mean Plasma Glucose 114 (calc)   eAG (mmol/L) 6.3 (calc)  CBC     Status: None   Collection Time: 10/31/16  9:42 AM  Result Value Ref Range   WBC 4.8 3.8 - 10.8 Thousand/uL   RBC 4.29 3.80 - 5.10 Million/uL   Hemoglobin 12.7 11.7 - 15.5 g/dL   HCT 38.0 35.0 - 45.0 %   MCV 88.6 80.0 - 100.0 fL   MCH 29.6 27.0 - 33.0 pg   MCHC 33.4 32.0 - 36.0 g/dL   RDW 12.3 11.0 - 15.0 %   Platelets 261 140 - 400 Thousand/uL   MPV 10.3 7.5 - 12.5 fL  Fe+TIBC+Fer     Status: None   Collection Time: 10/31/16  9:42 AM  Result Value Ref Range   Iron 74 45 - 160 mcg/dL   TIBC 314 250 - 450 mcg/dL (calc)   %SAT 24 11 - 50 % (calc)   Ferritin 63 20 - 288 ng/mL  Urinalysis, Routine  w reflex microscopic     Status: None   Collection Time: 10/31/16  9:42 AM  Result Value Ref Range   Color, Urine YELLOW YELLOW   APPearance CLEAR CLEAR   Specific Gravity, Urine 1.009 1.001 - 1.03   pH 7.5 5.0 - 8.0   Glucose, UA NEGATIVE NEGATIVE   Bilirubin Urine NEGATIVE NEGATIVE   Ketones, ur NEGATIVE NEGATIVE   Hgb urine dipstick NEGATIVE NEGATIVE   Protein, ur NEGATIVE NEGATIVE   Nitrite NEGATIVE NEGATIVE   Leukocytes, UA NEGATIVE NEGATIVE  Testosterone, Total, LC/MS/MS     Status: None   Collection Time: 10/31/16  9:42 AM  Result Value Ref Range   Testosterone, Total, LC-MS-MS 13 2 - 45 ng/dL    Comment: . For additional information, please refer to http://education.questdiagnostics.com/faq/ TotalTestosteroneLCMSMSFAQ165 (This link is being provided for informational/ educational purposes only.) . This test was developed and its analytical performance characteristics have been determined by [place], [place]. It has not been cleared or approved by the U.S. Food and Drug Administration. This assay has been validated pursuant to the CLIA regulations and is used for clinical purposes. .     No results found.  No flowsheet data found.  No flowsheet data found.    ASSESSMENT/PLAN:   Annual physical exam  Elevated DHEA ([place]) - Advised would consider decreasing supplementation, discussed with integrative health provider  Cervical cancer screening - Plan: Cytology - PAP  Need for tetanus booster -  Declined for now  Need for shingles vaccine - Declined for now, perception written, she has concerns about whether aluminum is in this vaccine, I'm not sure, would discuss with pharmacist  Breast cancer screening - Had some questions about breast MRI screening for dense breast tissue, we'll look into this [place] [phone] on last mammo report)  - Plan: MM DIGITAL SCREENING BILATERAL    Patient Instructions  Will look into MRI of  breast tissue for screening - I may need to speak to someone in Radiology. Or you can call [facility] at [phone] - this is the number on your most recent mammogram report.   See below for information on Tetanus booster and Shingles vaccination. I've printed a Rx for shingles vaccine - your pharmacist may be better able to answer the question on its aluminum contents, if it has this. They can also administer the vaccine if you desire.     FEMALE PREVENTIVE CARE Updated 12/01/16   ANNUAL SCREENING/COUNSELING  Diet/Exercise - HEALTHY HABITS DISCUSSED TO DECREASE CV RISK History  Smoking Status  . Never Smoker  Smokeless Tobacco  . Never Used   History  Alcohol Use  . 0.0 oz/week    Comment: every other day,, social use    Depression screen [facility] 2/9 12/01/2016  Decreased Interest 0  Down, Depressed, Hopeless 0  PHQ - 2 Score 0    Domestic violence concerns - no  HTN SCREENING - SEE [place]  Sexually active in the past year - Yes with female.  Need/want STI testing today? - no  Concerns about libido or pain with sex? - no  Plans for pregnancy? - n/a  INFECTIOUS DISEASE SCREENING  HIV - does not need  GC/CT - does not need  HepC - DOB [birth year] - does not need  TB - does not need  DISEASE SCREENING  Lipid - needs  DM2 - needs  Osteoporosis - women age 8+ - does not need  Fracture after age 60? no  Chronic steroid use? no  Smoking/Alcohol? no  Low body weight <127lb? no  Hip fracture in parent? no  Premature menopause, malabsorbtion, CLD, IBD, RA? no  CANCER SCREENING  Cervical - needs  Breast - needs  Lung - does not need  Colon - does not need  ADULT VACCINATION  Influenza - annual vaccine recommended  Td - booster every 10 years   Zoster - Shingrix recommended 50+  PCV13 - was not indicated  PPSV23 - was not indicated  There is no immunization history on file for this patient.   Follow-up plan: Return in  about 1 year (around 12/01/2017) for [place], sooner if needed .  Visit summary with medication list and pertinent instructions was printed for patient to review, alert [place] if any changes needed. All questions at time of visit were answered - patient instructed to contact office with any additional concerns. ER/RTC precautions were reviewed with the patient and understanding verbalized.

## 2016-12-06 LAB — CYTOLOGY - PAP
DIAGNOSIS: NEGATIVE
HPV (WINDOPATH): NOT DETECTED

## 2016-12-09 ENCOUNTER — Telehealth: Payer: Self-pay | Admitting: Osteopathic Medicine

## 2016-12-09 NOTE — Telephone Encounter (Signed)
This patient asked me about whether we do MRIs of the breast for screening purposes, in women with dense breast tissue? Apparently [facility] has a program for this...  Based on my research,no guidelines recommend MRI as an appropriate alternative to mammogram for screening in average risk people, so I'm not certain that insurance would cover this for her.   Can we call radiology and see if they know anything about this? If not, patient has contact number for [facility].

## 2016-12-12 NOTE — Telephone Encounter (Signed)
You can order a Breast MRI, they are only done at [facility]. The criteria would be: breast implant evaluation, family history of breast cancer, or newly diagnosed breast cancer staging. It is not used as a screening tool unless there is a strong family history (at least 2 direct family members). We do offer 3D mammo downstairs now.

## 2016-12-14 NOTE — Telephone Encounter (Signed)
spoke to patient gave her advise as noted below. Patient stated that she has already set up an appointment for a 3 D mammogram. Oneta Rack

## 2016-12-14 NOTE — Telephone Encounter (Signed)
Left VM for Pt to return clinic call.  

## 2016-12-14 NOTE — Telephone Encounter (Signed)
Please call patient: [facility] does not offer screening MRI if there is only concern for dense breast tissue, patient have to be particularly high risk which would include 2 first-degree family members with a history of breast cancer, I don't believe that she has this in her family history but can we confirm? We can certainly still do a 3-D mammogram downstairs and any abnormalities will be worked up according to the radiologist recommendations.

## 2016-12-23 ENCOUNTER — Ambulatory Visit (INDEPENDENT_AMBULATORY_CARE_PROVIDER_SITE_OTHER): Payer: Managed Care, Other (non HMO)

## 2016-12-23 DIAGNOSIS — Z1231 Encounter for screening mammogram for malignant neoplasm of breast: Secondary | ICD-10-CM | POA: Diagnosis not present

## 2017-03-14 ENCOUNTER — Encounter: Payer: Self-pay | Admitting: Osteopathic Medicine

## 2017-03-15 MED ORDER — KETOCONAZOLE 2 % EX SHAM: 1.0000 "application " | MEDICATED_SHAMPOO | CUTANEOUS | 11 refills | Status: DC

## 2017-09-19 ENCOUNTER — Encounter: Payer: Self-pay | Admitting: Osteopathic Medicine

## 2017-09-19 ENCOUNTER — Emergency Department (INDEPENDENT_AMBULATORY_CARE_PROVIDER_SITE_OTHER)
Admission: EM | Admit: 2017-09-19 | Discharge: 2017-09-19 | Disposition: A | Discharge status: Home / Self Care | Payer: Managed Care, Other (non HMO) | Source: Home / Self Care

## 2017-09-19 ENCOUNTER — Other Ambulatory Visit: Payer: Self-pay

## 2017-09-19 DIAGNOSIS — M545 Low back pain, unspecified: Secondary | ICD-10-CM

## 2017-09-19 DIAGNOSIS — M5440 Lumbago with sciatica, unspecified side: Secondary | ICD-10-CM

## 2017-09-19 LAB — POCT URINALYSIS DIP (MANUAL ENTRY)
Bilirubin, UA: NEGATIVE
GLUCOSE UA: NEGATIVE mg/dL
Ketones, POC UA: NEGATIVE mg/dL
Nitrite, UA: NEGATIVE
PH UA: 6.5 (ref 5.0–8.0)
Protein Ur, POC: NEGATIVE mg/dL
RBC UA: NEGATIVE
SPEC GRAV UA: 1.015 (ref 1.010–1.025)
Urobilinogen, UA: 0.2 E.U./dL

## 2017-09-19 MED ORDER — HYDROCODONE-ACETAMINOPHEN 5-325 MG PO TABS: 1.0000 | ORAL_TABLET | ORAL | 0 refills | Status: DC | PRN

## 2017-09-19 MED ORDER — LEVOTHYROXINE SODIUM 50 MCG PO TABS: 50.0000 ug | ORAL_TABLET | Freq: Every day | ORAL | 1 refills | Status: DC

## 2017-09-19 MED ORDER — METHOCARBAMOL 500 MG PO TABS
500.0000 mg | ORAL_TABLET | Freq: Two times a day (BID) | ORAL | 0 refills | Status: DC
Start: 2017-09-19 — End: 2017-11-03

## 2017-09-19 MED ORDER — LIOTHYRONINE SODIUM 25 MCG PO TABS: 25.0000 ug | ORAL_TABLET | Freq: Every day | ORAL | 1 refills | Status: DC

## 2017-09-19 MED ORDER — PROGESTERONE MICRONIZED 200 MG PO CAPS: 200.0000 mg | ORAL_CAPSULE | Freq: Every day | ORAL | 1 refills | Status: AC

## 2017-09-19 MED ORDER — METHYLPREDNISOLONE SODIUM SUCC 125 MG IJ SOLR
125.0000 mg | Freq: Once | INTRAMUSCULAR | Status: AC
Start: 2017-09-19 — End: 2017-09-19
  Administered 2017-09-19: 125 mg via INTRAMUSCULAR

## 2017-09-19 MED ORDER — KETOROLAC TROMETHAMINE 60 MG/2ML IM SOLN
60.0000 mg | Freq: Once | INTRAMUSCULAR | Status: AC
  Administered 2017-09-19: 60 mg via INTRAMUSCULAR

## 2017-09-19 NOTE — ED Triage Notes (Signed)
Pt c/o lower right sided back pain x 2 days. Radiates down leg. Tried tens unit, alternating heat and ice with no relief. Pain level 8/10

## 2017-09-19 NOTE — Discharge Instructions (Signed)
Return if any problems.

## 2017-09-19 NOTE — Telephone Encounter (Signed)
Can we call [facility] and have them send [place] a request for Biest?  I do not see an option that makes sense in epic to send this and we do not have it on the patient's medication list.

## 2017-09-20 NOTE — Telephone Encounter (Signed)
[facility], they will send over request.

## 2017-09-21 NOTE — ED Provider Notes (Signed)
[facility]    CSN: [id number] Arrival date & time: 09/19/17  1432     History   Chief Complaint Chief Complaint  Patient presents with  . Back Pain    Right side    HPI Brittney Jones is a 62 y.o. female.   The history is provided by the patient. No language interpreter was used.  Back Pain  Location:  Lumbar spine Pain severity:  Moderate Pain is:  Same all the time Timing:  Constant Progression:  Worsening Chronicity:  New Relieved by:  Nothing Worsened by:  Nothing Ineffective treatments:  None tried   Past Medical History:  Diagnosis Date  . Abnormal thermography 11/2014   left breast  . Anemia    both pernicious anemia and iron deficiency per patient  . Arthritis   . Colon polyp    2 adenomas 2016  . Dermatitis seborrheica   . Diverticulosis   . Fatty liver   . Hyperlipidemia   . Lupus ([place])   . Problems with hearing    auto immune  . Sjogrens syndrome ([place])   . Thyroid disease   . Vitamin deficiency    several    Patient Active Problem List   Diagnosis Date Noted  . Breast cancer screening 12/01/2016  . Need for shingles vaccine 12/01/2016  . Need for tetanus booster 12/01/2016  . Elevated DHEA ([place]) 12/01/2016  . Cervical cancer screening 12/01/2016  . Hashimoto's thyroiditis 10/31/2016  . Hyperlipidemia 10/31/2016  . Iron deficiency anemia 10/31/2016  . Pernicious anemia 10/31/2016  . Vitamin D deficiency 10/31/2016  . History of colon polyps 10/31/2016  . Blood present in stool 10/31/2016  . Seborrheic dermatitis of scalp 10/31/2016  . Hypothyroid 10/31/2016  . Sjogren's syndrome ([place]) 10/31/2016  . Elevated antinuclear antibody (ANA) level 10/31/2016  . Dense breast tissue on mammogram 10/31/2016    Past Surgical History:  Procedure Laterality Date  . COLONOSCOPY  2010   in [place]  . ESOPHAGOGASTRODUODENOSCOPY  2010   in [place]  . lip biospy    . scalp biospy      OB History   None      Home Medications     Prior to Admission medications   Medication Sig Start Date End Date Taking? Authorizing Provider  BETAMETHASONE DIPROPIONATE EX Apply topically.    [provider]  Cholecalciferol (VITAMIN D3) 10000 units capsule Take 10,000 Units by mouth daily.    [provider]  Choline & Mag Salicylates 9937 MG TABS Take by mouth daily.    [provider]  DHEA 10 MG TABS Take 10 mg by mouth daily.    [provider]  Digestive Enzymes (DIGESTIVE ENZYME PO) Take by mouth daily.    [provider]  Ferrous Fumarate-Vitamin C (FERRO-SEQUELS PO) Take by mouth.    [provider]  HYDROcodone-acetaminophen (NORCO/VICODIN) 5-325 MG tablet Take 1 tablet by mouth every 4 (four) hours as needed. 09/19/17   Fransico Meadow, PA-C  IODINE, KELP, PO Take 12.5 mg by mouth daily.    [provider]  ketoconazole (NIZORAL) 2 % shampoo Apply 1 application topically 2 (two) times a week. 03/16/17   Emeterio Reeve, DO  levothyroxine (SYNTHROID, LEVOTHROID) 50 MCG tablet Take 1 tablet (50 mcg total) by mouth daily before breakfast. 09/19/17   Emeterio Reeve, DO  liothyronine (CYTOMEL) 25 MCG tablet Take 1 tablet (25 mcg total) by mouth daily. 09/19/17   Emeterio Reeve, DO  Magnesium 400  MG CAPS Take 400 mg by mouth daily.    [provider]  methocarbamol (ROBAXIN) 500 MG tablet Take 1 tablet (500 mg total) by mouth 2 (two) times daily. 09/19/17   Fransico Meadow, PA-C  Methylcobalamin (B12-ACTIVE PO) Take 5,000 mg by mouth daily.     [provider]  Multiple Vitamins-Minerals (MULTIVITAMIN ADULT PO) Take by mouth daily.    [provider]  NON FORMULARY Take 1 packet by mouth daily.    [provider]  Omega 3 1200 MG CAPS Take by mouth daily.    [provider]  OVER THE COUNTER MEDICATION Arthrosan10% APPLY TO AFFECTED AREA PRN    [provider]  OVER THE COUNTER MEDICATION Meriva bid     [provider]  Probiotic Product (PROBIOTIC ADVANCED PO) Take by mouth daily.    [provider]  progesterone (PROMETRIUM) 200 MG capsule Take 1 capsule (200 mg total) by mouth daily. 09/19/17   Emeterio Reeve, DO  Progesterone Wettable POWD by [place].(Non-Drug; Combo Route) route.    [provider]  TESTOSTERONE BU Place inside cheek.    [provider]    Family History Family History  Problem Relation Age of Onset  . Breast cancer Unknown        mat great aunt  . Colon polyps Mother   . Heart disease Mother   . Colon polyps Father   . Heart disease Father   . Colon polyps Brother   . Colon polyps Sister   . Clotting disorder Maternal Uncle   . Diabetes Paternal Aunt   . Stroke Maternal Uncle   . Liver disease Unknown        multiple people on mom's side  . Kidney cancer Paternal Grandmother   . Leukemia Maternal Grandmother   . Lung cancer Maternal Uncle   . Stroke Maternal Grandfather   . Colon cancer Neg Hx     Social History Social History   Tobacco Use  . Smoking status: Never Smoker  . Smokeless tobacco: Never Used  Substance Use Topics  . Alcohol use: Yes    Alcohol/week: 0.0 oz    Comment: every other day,, social use   . Drug use: No     Allergies   Patient has no known allergies.   Review of Systems Review of Systems  Musculoskeletal: Positive for back pain.  All other systems reviewed and are negative.    Physical Exam Triage Vital Signs ED Triage Vitals  Enc Vitals Group     BP 09/19/17 1506 135/88     Pulse Rate 09/19/17 1506 86     Resp --      Temp 09/19/17 1506 97.6 F (36.4 C)     Temp Source 09/19/17 1506 Oral     SpO2 09/19/17 1506 97 %     Weight 09/19/17 1507 152 lb (68.9 kg)     Height 09/19/17 1507 5\' 5"  (1.651 m)     Head Circumference --      Peak Flow --      Pain Score 09/19/17 1507 8     Pain Loc --      Pain Edu? --      Excl. in [place]? --    No data found.  Updated Vital  Signs BP 135/88 (BP Location: Right Arm)   Pulse 86   Temp 97.6 F (36.4 C) (Oral)   Ht 5\' 5"  (1.651 m)   Wt 152 lb (68.9 kg)  SpO2 97%   BMI 25.29 kg/m   Visual Acuity Right Eye Distance:   Left Eye Distance:   Bilateral Distance:    Right Eye Near:   Left Eye Near:    Bilateral Near:     Physical Exam  Constitutional: She appears well-developed and well-nourished.  Cardiovascular: Normal rate.  Pulmonary/Chest: Effort normal.  Abdominal: Soft.  Musculoskeletal: Normal range of motion.  Tender ls spine diffusely from  nv and ns intact  Neurological: She is alert.  Skin: Skin is warm.  Psychiatric: She has a normal mood and affect.  Nursing note and vitals reviewed.    UC Treatments / Results  Labs (all labs ordered are listed, but only abnormal results are displayed) Labs Reviewed  POCT URINALYSIS DIP (MANUAL ENTRY) - Abnormal; Notable for the following components:      Result Value   Leukocytes, UA Trace (*)    All other components within normal limits    EKG None  Radiology No results found.  Procedures Procedures (including critical care time)  Medications Ordered in UC Medications  ketorolac (TORADOL) injection 60 mg (60 mg Intramuscular Given 09/19/17 1534)  methylPREDNISolone sodium succinate (SOLU-MEDROL) 125 mg/2 mL injection 125 mg (125 mg Intramuscular Given 09/19/17 1534)    Initial Impression / Assessment and Plan / UC Course  I have reviewed the triage vital signs and the nursing notes.  Pertinent labs & imaging results that were available during my care of the patient were reviewed by me and considered in my medical decision making (see chart for details).    Pt advised to follow up with Dr. Sheppard Coil for recheck  Final Clinical Impressions(s) / UC Diagnoses   Final diagnoses:  Acute midline low back pain with sciatica, sciatica laterality unspecified  Acute low back pain without sciatica, unspecified back pain laterality      Discharge Instructions     Return if any problems.   ED Prescriptions    Medication Sig Dispense Auth. Provider   methocarbamol (ROBAXIN) 500 MG tablet Take 1 tablet (500 mg total) by mouth 2 (two) times daily. 20 tablet Rishita Petron K, [place]   HYDROcodone-acetaminophen (NORCO/VICODIN) 5-325 MG tablet Take 1 tablet by mouth every 4 (four) hours as needed. 10 tablet Fransico Meadow, [place]     Controlled Substance Prescriptions [place] Controlled Substance Registry consulted? Not Applicable   Fransico Meadow, [place] 09/21/17 1534

## 2017-10-26 ENCOUNTER — Encounter: Payer: Self-pay | Admitting: Osteopathic Medicine

## 2017-10-27 ENCOUNTER — Encounter: Payer: Self-pay | Admitting: Osteopathic Medicine

## 2017-10-31 ENCOUNTER — Ambulatory Visit: Payer: Managed Care, Other (non HMO) | Admitting: Osteopathic Medicine

## 2017-11-03 ENCOUNTER — Ambulatory Visit (INDEPENDENT_AMBULATORY_CARE_PROVIDER_SITE_OTHER): Payer: Managed Care, Other (non HMO) | Admitting: Osteopathic Medicine

## 2017-11-03 ENCOUNTER — Encounter: Payer: Self-pay | Admitting: Osteopathic Medicine

## 2017-11-03 VITALS — BP 113/59 | HR 51 | Temp 97.8°F | Wt 152.0 lb

## 2017-11-03 DIAGNOSIS — Z23 Encounter for immunization: Secondary | ICD-10-CM

## 2017-11-03 DIAGNOSIS — Z1231 Encounter for screening mammogram for malignant neoplasm of breast: Secondary | ICD-10-CM

## 2017-11-03 DIAGNOSIS — E039 Hypothyroidism, unspecified: Secondary | ICD-10-CM

## 2017-11-03 DIAGNOSIS — M3503 Sicca syndrome with myopathy: Secondary | ICD-10-CM

## 2017-11-03 DIAGNOSIS — Z1239 Encounter for other screening for malignant neoplasm of breast: Secondary | ICD-10-CM

## 2017-11-03 DIAGNOSIS — E782 Mixed hyperlipidemia: Secondary | ICD-10-CM

## 2017-11-03 DIAGNOSIS — Z0000 Encounter for general adult medical examination without abnormal findings: Secondary | ICD-10-CM

## 2017-11-03 DIAGNOSIS — Z86010 Personal history of colonic polyps: Secondary | ICD-10-CM

## 2017-11-03 DIAGNOSIS — E559 Vitamin D deficiency, unspecified: Secondary | ICD-10-CM

## 2017-11-03 DIAGNOSIS — B078 Other viral warts: Secondary | ICD-10-CM

## 2017-11-03 DIAGNOSIS — D510 Vitamin B12 deficiency anemia due to intrinsic factor deficiency: Secondary | ICD-10-CM | POA: Diagnosis not present

## 2017-11-03 DIAGNOSIS — Z0189 Encounter for other specified special examinations: Secondary | ICD-10-CM

## 2017-11-03 DIAGNOSIS — D509 Iron deficiency anemia, unspecified: Secondary | ICD-10-CM | POA: Diagnosis not present

## 2017-11-03 DIAGNOSIS — R011 Cardiac murmur, unspecified: Secondary | ICD-10-CM

## 2017-11-03 NOTE — Patient Instructions (Addendum)
General Preventive Care  Most recent routine screening lipids/other labs: ordered today  Tobacco: don't! Alcohol: responsible moderation is ok for most people. Recreational/Illicit Drugs: don't!  Exercise: as tolerated to reduce risk of cardiovascular disease and diabetes  Mental health: if need for mental health care (medicines, counseling, other), or concerns about moods, please let me know!   Sexual health: if need for STD testing, or if libido/pain issues, please let me know!   Vaccines  Flu vaccine: recommended every fall (by Halloween!)  Shingles vaccine: Shingrix recommended after age 5 - we've got you on the list to get this shot when we have it in stock   Pneumonia vaccines: Prevnar and Pneumovax recommended after age 31  Tetanus booster: Tdap recommended every 10 years - done today   Cancer screenings   Colon cancer screening: follow up as directed by GI   Breast cancer screening: mammogram recommended at age 15 - ordered today  Cervical cancer screening: every 1 to 5 years depending on age and other risk factors. Can stop at age 86 or w/ hysterectomy as long as previous testing was normal.   Infection screenings . HIV: recommended screening at least once age 69-65 . Gonorrhea/Chlamydia: screening as needed . Hepatitis C: recommended for anyone born 73-1966  Other . Bone Density Test: recommended for women at age 11, sooner depending on risk factors . Advanced Directive: Living Will and/or Healthcare Power of Attorney recommended for all adults, regardless of age or health . Cholesterol & Diabetes: recommended screening annually . routine screening not recommended for annual physical for some of the labs we've ordered, please be prepared to get a bill for this testing!

## 2017-11-03 NOTE — Progress Notes (Signed)
HPI: Brittney Jones is a 62 y.o. female who  has a past medical history of Abnormal thermography (11/2014), Anemia, Arthritis, Colon polyp, Dermatitis seborrheica, Diverticulosis, Fatty liver, Hyperlipidemia, Lupus ([place]), Problems with hearing, Sjogrens syndrome ([place]), Thyroid disease, and Vitamin deficiency.  she presents to [facility] today, 11/03/17,  for chief complaint of: Annual physical    Patient here for annual physical / wellness exam.  See preventive care reviewed as below.    Additional concerns today include:  Spot on left second toe she would like looked at, tried tea tree oil on this a while ago and it resolved but seems to have recurred.     Past medical, surgical, social and family history reviewed:  Patient Active Problem List   Diagnosis Date Noted  . Breast cancer screening 12/01/2016  . Need for shingles vaccine 12/01/2016  . Need for tetanus booster 12/01/2016  . Elevated DHEA ([place]) 12/01/2016  . Cervical cancer screening 12/01/2016  . Hashimoto's thyroiditis 10/31/2016  . Hyperlipidemia 10/31/2016  . Iron deficiency anemia 10/31/2016  . Pernicious anemia 10/31/2016  . Vitamin D deficiency 10/31/2016  . History of colon polyps 10/31/2016  . Blood present in stool 10/31/2016  . Seborrheic dermatitis of scalp 10/31/2016  . Hypothyroid 10/31/2016  . Sjogren's syndrome ([place]) 10/31/2016  . Elevated antinuclear antibody (ANA) level 10/31/2016  . Dense breast tissue on mammogram 10/31/2016    Past Surgical History:  Procedure Laterality Date  . COLONOSCOPY  2010   in [place]  . ESOPHAGOGASTRODUODENOSCOPY  2010   in [place]  . lip biospy    . scalp biospy      Social History   Tobacco Use  . Smoking status: Never Smoker  . Smokeless tobacco: Never Used  Substance Use Topics  . Alcohol use: Yes    Alcohol/week: 0.0 standard drinks    Comment: every other day,, social use     Family History  Problem Relation Age of  Onset  . Breast cancer Unknown        mat great aunt  . Colon polyps Mother   . Heart disease Mother   . Colon polyps Father   . Heart disease Father   . Colon polyps Brother   . Colon polyps Sister   . Clotting disorder Maternal Uncle   . Diabetes Paternal Aunt   . Stroke Maternal Uncle   . Liver disease Unknown        multiple people on mom's side  . Kidney cancer Paternal Grandmother   . Leukemia Maternal Grandmother   . Lung cancer Maternal Uncle   . Stroke Maternal Grandfather   . Colon cancer Neg Hx      Current medication list and allergy/intolerance information reviewed:    Current Outpatient Medications  Medication Sig Dispense Refill  . BETAMETHASONE DIPROPIONATE EX Apply topically.    . Cholecalciferol (VITAMIN D3) 10000 units capsule Take 10,000 Units by mouth daily.    Marland Kitchen DHEA 10 MG TABS Take 10 mg by mouth daily.    . Digestive Enzymes (DIGESTIVE ENZYME PO) Take by mouth daily.    Marland Kitchen HYDROcodone-acetaminophen (NORCO/VICODIN) 5-325 MG tablet Take 1 tablet by mouth every 4 (four) hours as needed. 10 tablet 0  . IODINE, KELP, PO Take 12.5 mg by mouth daily.    Marland Kitchen ketoconazole (NIZORAL) 2 % shampoo Apply 1 application topically 2 (two) times a week. 120 mL 11  . levothyroxine (SYNTHROID, LEVOTHROID) 50 MCG tablet Take 1 tablet (50  mcg total) by mouth daily before breakfast. 90 tablet 1  . liothyronine (CYTOMEL) 25 MCG tablet Take 1 tablet (25 mcg total) by mouth daily. 90 tablet 1  . Magnesium 400 MG CAPS Take 400 mg by mouth daily.    . methocarbamol (ROBAXIN) 500 MG tablet Take 1 tablet (500 mg total) by mouth 2 (two) times daily. 20 tablet 0  . Methylcobalamin (B12-ACTIVE PO) Take 5,000 mg by mouth daily.     . Multiple Vitamins-Minerals (MULTIVITAMIN ADULT PO) Take by mouth daily.    . NON FORMULARY Take 1 packet by mouth daily.    . Omega 3 1200 MG CAPS Take by mouth daily.    Marland Kitchen OVER THE COUNTER MEDICATION Arthrosan10% APPLY TO AFFECTED AREA PRN    . OVER THE  COUNTER MEDICATION Meriva bid    . Probiotic Product (PROBIOTIC ADVANCED PO) Take by mouth daily.    . progesterone (PROMETRIUM) 200 MG capsule Take 1 capsule (200 mg total) by mouth daily. 90 capsule 1  . Progesterone Wettable POWD by [place].(Non-Drug; Combo Route) route.    . TESTOSTERONE BU Place inside cheek.     No current facility-administered medications for this visit.     No Known Allergies    Review of Systems:  Constitutional:  No  fever, no chills, No recent illness, No unintentional weight changes. No significant fatigue.   HEENT: No  headache, no vision change, no hearing change, No sore throat, No  sinus pressure  Cardiac: No  chest pain, No  pressure, No palpitations, No  Orthopnea  Respiratory:  No  shortness of breath. No  Cough  Gastrointestinal: No  abdominal pain, No  nausea, No  vomiting,  No  blood in stool, No  diarrhea, No  constipation   Musculoskeletal: No new myalgia/arthralgia  Skin: No  Rash, No other wounds/concerning lesions  Genitourinary: No  incontinence, No  abnormal genital bleeding, No abnormal genital discharge  Hem/Onc: No  easy bruising/bleeding, No  abnormal lymph node  Endocrine: No cold intolerance,  No heat intolerance. No polyuria/polydipsia/polyphagia   Neurologic: No  weakness, No  dizziness, No  slurred speech/focal weakness/facial droop  Psychiatric: No  concerns with depression, No  concerns with anxiety, No sleep problems, No mood problems  Exam:  BP (!) 113/59   Pulse (!) 51   Temp 97.8 F (36.6 C) (Oral)   Wt 152 lb (68.9 kg)   SpO2 98%   BMI 25.29 kg/m   Constitutional: VS see above. General Appearance: alert, well-developed, well-nourished, NAD  Eyes: Normal lids and conjunctive, non-icteric sclera  Ears, Nose, Mouth, Throat: MMM, Normal external inspection ears/nares/mouth/lips/gums. TM normal bilaterally. Pharynx/tonsils no erythema, no exudate. Nasal mucosa normal.   Neck: No masses, trachea midline. No  thyroid enlargement. No tenderness/mass appreciated. No lymphadenopathy  Respiratory: Normal respiratory effort. no wheeze, no rhonchi, no rales  Cardiovascular: S1/S2 normal, +7/5 systolic murmur, no rub/gallop auscultated. RRR. No lower extremity edema.   Gastrointestinal: Nontender, no masses. No hepatomegaly, no splenomegaly. No hernia appreciated. Bowel sounds normal. Rectal exam deferred.   Musculoskeletal: Gait normal. No clubbing/cyanosis of digits.   Neurological: Normal balance/coordination. No tremor. No cranial nerve deficit on limited exam. Motor and sensation intact and symmetric. Cerebellar reflexes intact.   Skin: warm, dry, intact. No rash/ulcer. No concerning nevi or subq nodules on limited exam.  Wart on 2nd L toe, cryo applied pt tolerated well   Psychiatric: Normal judgment/insight. Normal mood and affect. Oriented x3.  ASSESSMENT/PLAN: The primary encounter diagnosis was Annual physical exam. 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   Orders Placed This Encounter  Procedures  . CBC  . COMPLETE METABOLIC PANEL WITH GFR  . Lipid panel  . HIV Antibody (routine testing w rflx)  . Hepatitis C antibody  . TSH  . Vitamin B12  . VITAMIN D 25 Hydroxy (Vit-D Deficiency, Fractures)  . ANA  . Sedimentation rate  . Magnesium  . Fe+TIBC+Fer  . T4, free  . T3  . T3, reverse       Patient Instructions  General Preventive Care  Most recent routine screening lipids/other labs: ordered today  Tobacco: don't! Alcohol: responsible moderation is ok for most people. Recreational/Illicit Drugs: don't!  Exercise: as tolerated to reduce risk of cardiovascular disease and diabetes  Mental  health: if need for mental health care (medicines, counseling, other), or concerns about moods, please let me know!   Sexual health: if need for STD testing, or if libido/pain issues, please let me know!   Vaccines  Flu vaccine: recommended every fall (by Halloween!)  Shingles vaccine: Shingrix recommended after age 86 - we've got you on the list to get this shot when we have it in stock   Pneumonia vaccines: Prevnar and Pneumovax recommended after age 62  Tetanus booster: Tdap recommended every 10 years - done today   Cancer screenings   Colon cancer screening: follow up as directed by GI   Breast cancer screening: mammogram recommended at age 41 - ordered today  Cervical cancer screening: every 1 to 5 years depending on age and other risk factors. Can stop at age 22 or w/ hysterectomy as long as previous testing was normal.   Infection screenings . HIV: recommended screening at least once age 45-65 . Gonorrhea/Chlamydia: screening as needed . Hepatitis C: recommended for anyone born 54-1966  Other . Bone Density Test: recommended for women at age 46, sooner depending on risk factors . Advanced Directive: Living Will and/or Healthcare Power of Attorney recommended for all adults, regardless of age or health . Cholesterol & Diabetes: recommended screening annually . routine screening not recommended for annual physical for some of the labs we've ordered, please be prepared to get a bill for this testing!     Visit summary with medication list and pertinent instructions was printed for patient to review. All questions at time of visit were answered - patient instructed to contact office with any additional concerns. ER/RTC precautions were reviewed with the patient.   Follow-up plan: Return in about 1 year (around 11/04/2018) for annual physical, sooner if needed! .    Please note: voice recognition software was used to produce this document, and typos may escape review.  Please contact Dr. Sheppard Coil for any needed clarifications.

## 2017-11-07 ENCOUNTER — Telehealth: Payer: Self-pay

## 2017-11-07 NOTE — Telephone Encounter (Signed)
added

## 2017-11-07 NOTE — Telephone Encounter (Signed)
-----   Message from Emeterio Reeve, DO sent at 11/03/2017 11:20 AM EDT ----- Regarding: shingrix shingrix list

## 2017-11-08 LAB — COMPLETE METABOLIC PANEL WITH GFR
AG RATIO: 1.8 (calc) (ref 1.0–2.5)
ALBUMIN MSPROF: 4.1 g/dL (ref 3.6–5.1)
ALT: 21 U/L (ref 6–29)
AST: 15 U/L (ref 10–35)
Alkaline phosphatase (APISO): 51 U/L (ref 33–130)
BILIRUBIN TOTAL: 0.7 mg/dL (ref 0.2–1.2)
BUN: 13 mg/dL (ref 7–25)
CHLORIDE: 104 mmol/L (ref 98–110)
CO2: 30 mmol/L (ref 20–32)
Calcium: 9.5 mg/dL (ref 8.6–10.4)
Creat: 0.82 mg/dL (ref 0.50–0.99)
GFR, EST AFRICAN AMERICAN: 88 mL/min/{1.73_m2} (ref >=60)
GFR, Est Non African American: 76 mL/min/{1.73_m2} (ref >=60)
GLOBULIN: 2.3 g/dL (ref 1.9–3.7)
GLUCOSE: 92 mg/dL (ref 65–139)
Potassium: 4.2 mmol/L (ref 3.5–5.3)
SODIUM: 141 mmol/L (ref 135–146)
TOTAL PROTEIN: 6.4 g/dL (ref 6.1–8.1)

## 2017-11-08 LAB — LIPID PANEL
Cholesterol: 277 mg/dL — ABNORMAL HIGH (ref <200)
HDL: 64 mg/dL (ref >50)
LDL CHOLESTEROL (CALC): 187 mg/dL — AB
Non-HDL Cholesterol (Calc): 213 mg/dL (calc) — ABNORMAL HIGH (ref <130)
Total CHOL/HDL Ratio: 4.3 (calc) (ref <5.0)
Triglycerides: 125 mg/dL (ref <150)

## 2017-11-08 LAB — CBC
HCT: 37.7 % (ref 35.0–45.0)
Hemoglobin: 12.8 g/dL (ref 11.7–15.5)
MCH: 30 pg (ref 27.0–33.0)
MCHC: 34 g/dL (ref 32.0–36.0)
MCV: 88.5 fL (ref 80.0–100.0)
MPV: 10.1 fL (ref 7.5–12.5)
PLATELETS: 269 10*3/uL (ref 140–400)
RBC: 4.26 10*6/uL (ref 3.80–5.10)
RDW: 12.3 % (ref 11.0–15.0)
WBC: 4.2 10*3/uL (ref 3.8–10.8)

## 2017-11-08 LAB — HEPATITIS C ANTIBODY
HEP C AB: NONREACTIVE
SIGNAL TO CUT-OFF: 0.01 (ref <1.00)

## 2017-11-08 LAB — IRON,TIBC AND FERRITIN PANEL
%SAT: 32 % (calc) (ref 16–45)
FERRITIN: 70 ng/mL (ref 16–288)
IRON: 94 ug/dL (ref 45–160)
TIBC: 295 ug/dL (ref 250–450)

## 2017-11-08 LAB — ANTI-NUCLEAR AB-TITER (ANA TITER): ANA Titer 1: 1:80 {titer} — ABNORMAL HIGH

## 2017-11-08 LAB — TSH: TSH: 2.2 mIU/L (ref 0.40–4.50)

## 2017-11-08 LAB — VITAMIN D 25 HYDROXY (VIT D DEFICIENCY, FRACTURES): Vit D, 25-Hydroxy: 45 ng/mL (ref 30–100)

## 2017-11-08 LAB — ANA: Anti Nuclear Antibody(ANA): POSITIVE — AB

## 2017-11-08 LAB — SEDIMENTATION RATE: Sed Rate: 2 mm/h (ref 0–30)

## 2017-11-08 LAB — T3: T3, Total: 73 ng/dL — ABNORMAL LOW (ref 76–181)

## 2017-11-08 LAB — MAGNESIUM: Magnesium: 1.8 mg/dL (ref 1.5–2.5)

## 2017-11-08 LAB — VITAMIN B12: VITAMIN B 12: 1522 pg/mL — AB (ref 200–1100)

## 2017-11-08 LAB — T3, REVERSE: T3, Reverse: 6 ng/dL — ABNORMAL LOW (ref 8–25)

## 2017-11-08 LAB — HIV ANTIBODY (ROUTINE TESTING W REFLEX): HIV: NONREACTIVE

## 2017-11-08 LAB — T4, FREE: Free T4: 0.6 ng/dL — ABNORMAL LOW (ref 0.8–1.8)

## 2017-11-09 ENCOUNTER — Encounter: Payer: Self-pay | Admitting: Osteopathic Medicine

## 2017-11-09 DIAGNOSIS — E039 Hypothyroidism, unspecified: Secondary | ICD-10-CM

## 2017-11-09 MED ORDER — LEVOTHYROXINE SODIUM 75 MCG PO TABS: 75.0000 ug | ORAL_TABLET | Freq: Every day | ORAL | 0 refills | Status: DC

## 2017-11-23 ENCOUNTER — Ambulatory Visit (HOSPITAL_BASED_OUTPATIENT_CLINIC_OR_DEPARTMENT_OTHER)
Admission: RE | Admit: 2017-11-23 | Discharge: 2017-11-23 | Disposition: A | Discharge status: Home / Self Care | Payer: Managed Care, Other (non HMO) | Source: Ambulatory Visit | Attending: Osteopathic Medicine | Admitting: Osteopathic Medicine

## 2017-11-23 DIAGNOSIS — R011 Cardiac murmur, unspecified: Secondary | ICD-10-CM | POA: Insufficient documentation

## 2017-11-23 DIAGNOSIS — I313 Pericardial effusion (noninflammatory): Secondary | ICD-10-CM | POA: Diagnosis not present

## 2017-11-23 DIAGNOSIS — E785 Hyperlipidemia, unspecified: Secondary | ICD-10-CM | POA: Insufficient documentation

## 2017-11-23 NOTE — Progress Notes (Signed)
  Echocardiogram 2D Echocardiogram has been performed.  Sherry Blackard T Adam Demary 11/23/2017, 4:30 PM

## 2017-11-29 ENCOUNTER — Encounter: Payer: Self-pay | Admitting: Osteopathic Medicine

## 2017-11-30 ENCOUNTER — Encounter: Payer: Self-pay | Admitting: Osteopathic Medicine

## 2017-12-07 ENCOUNTER — Ambulatory Visit (INDEPENDENT_AMBULATORY_CARE_PROVIDER_SITE_OTHER): Payer: Managed Care, Other (non HMO)

## 2017-12-07 ENCOUNTER — Ambulatory Visit (INDEPENDENT_AMBULATORY_CARE_PROVIDER_SITE_OTHER): Payer: Managed Care, Other (non HMO) | Admitting: Osteopathic Medicine

## 2017-12-07 VITALS — Temp 97.9°F | Ht 65.0 in | Wt 152.0 lb

## 2017-12-07 DIAGNOSIS — Z1231 Encounter for screening mammogram for malignant neoplasm of breast: Secondary | ICD-10-CM | POA: Diagnosis not present

## 2017-12-07 DIAGNOSIS — Z23 Encounter for immunization: Secondary | ICD-10-CM | POA: Diagnosis not present

## 2017-12-07 NOTE — Progress Notes (Signed)
Patient presents to clinic today for her first Shingrix vaccine. Patient denies any recent fever or illness. Patient's temperature today was 97.9 (oral). Shingrix vaccine given in the left deltoid without complications. Patient advised to return to clinic in two months for her 2nd Shingrix vaccine. Patient verbalized understanding and appointment has been made. No further questions or concerns at this time.

## 2018-01-07 ENCOUNTER — Other Ambulatory Visit: Payer: Self-pay | Admitting: Osteopathic Medicine

## 2018-01-08 NOTE — Telephone Encounter (Signed)
[facility] requesting med refill for levothyroxine. Pls advise if appropriate. Thanks.

## 2018-01-08 NOTE — Telephone Encounter (Signed)
Left a detailed vm msg for pt regarding provider's note. Direct call back info provided.  

## 2018-01-08 NOTE — Telephone Encounter (Signed)
Please call patient: She needs labs done first since we changed dose - orders are in  Will review results and consider appropriateness of refill at current dose or if we need to change

## 2018-01-09 LAB — THYROID PANEL WITH TSH
Free Thyroxine Index: 1.7 (ref 1.4–3.8)
T3 UPTAKE: 32 % (ref 22–35)
T4, Total: 5.4 ug/dL (ref 5.1–11.9)
TSH: 0.04 mIU/L — ABNORMAL LOW (ref 0.40–4.50)

## 2018-01-11 NOTE — Telephone Encounter (Signed)
Pt has seen results on MyChart and message also sent for patient to call back if any questions.

## 2018-01-12 MED ORDER — LEVOTHYROXINE SODIUM 50 MCG PO TABS: ORAL_TABLET | ORAL | 0 refills | Status: DC

## 2018-01-12 MED ORDER — LEVOTHYROXINE SODIUM 75 MCG PO TABS: ORAL_TABLET | ORAL | 0 refills | Status: DC

## 2018-02-16 ENCOUNTER — Ambulatory Visit (INDEPENDENT_AMBULATORY_CARE_PROVIDER_SITE_OTHER): Payer: 59 | Admitting: Osteopathic Medicine

## 2018-02-16 VITALS — Temp 97.6°F | Ht 65.0 in | Wt 152.0 lb

## 2018-02-16 DIAGNOSIS — Z23 Encounter for immunization: Secondary | ICD-10-CM | POA: Diagnosis not present

## 2018-02-16 NOTE — Progress Notes (Signed)
Patient is here today for her 2nd shingles vaccine. 1st shingles vaccine was given on 12/07/2017. Vaccine was given today in the left deltoid without complications. Patient temperature was 97.6 (oral). No further questions or concerns at this time.

## 2018-04-06 ENCOUNTER — Other Ambulatory Visit: Payer: Self-pay | Admitting: Osteopathic Medicine

## 2018-04-06 DIAGNOSIS — E063 Autoimmune thyroiditis: Secondary | ICD-10-CM

## 2018-04-06 DIAGNOSIS — E039 Hypothyroidism, unspecified: Secondary | ICD-10-CM

## 2018-04-06 NOTE — Telephone Encounter (Signed)
[facility] requesting med refill for levothyroxine 50 mg & 75 mg. Last thyroid check was on 11/03/17. Pls advise if refill is appropriate. Thanks.

## 2018-04-07 NOTE — Telephone Encounter (Signed)
Needs to repeat thyroid labs, order is in for her to come to the lab, can come at any time does not need to be fasting, refills pended until we have lab results, or okay to fill 30-day supply of each if needed

## 2018-04-09 ENCOUNTER — Other Ambulatory Visit: Payer: Self-pay | Admitting: Osteopathic Medicine

## 2018-04-09 NOTE — Telephone Encounter (Signed)
Pt has been updated of provider's note. Aware that rx refills for levothyroxine 50 mcg & 75 mcg are pending until thyroid check. Pt will stop by the lab later on today or this week for lab draw. No other inquiries during call.

## 2018-04-10 LAB — TSH: TSH: 0.74 mIU/L (ref 0.40–4.50)

## 2018-04-11 ENCOUNTER — Other Ambulatory Visit: Payer: Self-pay | Admitting: Osteopathic Medicine

## 2018-04-11 MED ORDER — LEVOTHYROXINE SODIUM 75 MCG PO TABS: ORAL_TABLET | ORAL | 3 refills | Status: DC

## 2018-04-11 MED ORDER — LEVOTHYROXINE SODIUM 50 MCG PO TABS: ORAL_TABLET | ORAL | 3 refills | Status: DC

## 2018-04-19 ENCOUNTER — Other Ambulatory Visit: Payer: Self-pay | Admitting: Osteopathic Medicine

## 2018-04-19 NOTE — Telephone Encounter (Signed)
Per [facility] she is on 2 different strenghts of Levothyroxine. Is she taking Cytomel as well? Please advise

## 2018-04-19 NOTE — Telephone Encounter (Signed)
Please review for refill- patient at [facility] 

## 2018-04-20 NOTE — Telephone Encounter (Signed)
Can we call patient to confirm which levothyroxine dose she is taking?  She was asked to verify whether she is taking the 50 mcg per day only, or if she is alternating between the 50 and the 75 mcg doses? (see result note 01/10/2018) to confirm that she is still taking the liothyronine 25 mcg, I believe she is but just to check if this is a automatic pharmacy request or if this is something that the patient has been taking?  What ever she has been on in the 6 weeks prior to her recent lab draw is fine, as TSH was normal then

## 2018-04-23 NOTE — Telephone Encounter (Signed)
Patient is alternating between the 76mcg and 49mcg. Refill sent to pharmacy

## 2018-08-14 ENCOUNTER — Other Ambulatory Visit: Payer: Self-pay

## 2018-08-14 MED ORDER — KETOCONAZOLE 2 % EX SHAM: 1.0000 "application " | MEDICATED_SHAMPOO | CUTANEOUS | 3 refills | Status: DC

## 2018-09-12 ENCOUNTER — Encounter: Payer: Self-pay | Admitting: Gastroenterology

## 2018-10-02 ENCOUNTER — Other Ambulatory Visit: Payer: Self-pay | Admitting: Osteopathic Medicine

## 2018-10-02 NOTE — Telephone Encounter (Signed)
Forwarding medication refill request to PCP for review. 

## 2019-04-01 ENCOUNTER — Other Ambulatory Visit: Payer: Self-pay | Admitting: Osteopathic Medicine

## 2019-04-01 ENCOUNTER — Other Ambulatory Visit: Payer: Self-pay

## 2019-04-01 ENCOUNTER — Telehealth: Payer: Self-pay

## 2019-04-01 DIAGNOSIS — E039 Hypothyroidism, unspecified: Secondary | ICD-10-CM

## 2019-04-01 DIAGNOSIS — E063 Autoimmune thyroiditis: Secondary | ICD-10-CM

## 2019-04-01 NOTE — Progress Notes (Signed)
[facility] for levothyroxine 50 mg and 75 mg. Last thyroid check was 04/10/18, results normal. Left a detailed vm msg for pt. Aware lab check is due and order entered. Pt informed #30 days refill sent to local pharmacy. Direct call back info provided.

## 2019-04-01 NOTE — Telephone Encounter (Signed)
Pt left a vm msg requesting a call back. She has new Forensic scientist (medicaid). Pls contact pt. Thanks.

## 2019-04-02 ENCOUNTER — Encounter: Payer: Self-pay | Admitting: Osteopathic Medicine

## 2019-04-02 NOTE — Telephone Encounter (Signed)
Patient has been made aware. Will bring it to next appointment. NO further questions at this time.

## 2019-04-03 NOTE — Telephone Encounter (Signed)
I called patient. She just wanted to know if Medicare covers TSH. I advised yes because she has Hypothyroid.

## 2019-04-09 ENCOUNTER — Other Ambulatory Visit: Payer: Self-pay | Admitting: Osteopathic Medicine

## 2019-04-09 LAB — TSH: TSH: 0.42 mIU/L (ref 0.40–4.50)

## 2019-04-09 MED ORDER — LEVOTHYROXINE SODIUM 75 MCG PO TABS: ORAL_TABLET | ORAL | 3 refills | Status: DC

## 2019-04-09 MED ORDER — LEVOTHYROXINE SODIUM 50 MCG PO TABS
ORAL_TABLET | ORAL | 3 refills | Status: DC
Start: 2019-04-09 — End: 2020-06-08

## 2019-04-24 ENCOUNTER — Other Ambulatory Visit: Payer: Self-pay | Admitting: Osteopathic Medicine

## 2019-04-24 NOTE — Telephone Encounter (Signed)
Requested medication (s) are due for refill today: yes  Requested medication (s) are on the active medication list:yes  Last refill:  01/13/2019  Future visit scheduled; no  Notes to clinic:  review for refill   Requested Prescriptions  Pending Prescriptions Disp Refills   liothyronine (CYTOMEL) 25 MCG tablet [Pharmacy Med Name: LIOTHYRONINE SOD 25 MCG TAB] 90 tablet 5    Sig: TAKE 1 TABLET BY MOUTH EVERY DAY      Endocrinology:  Hypothyroid Agents Failed - 04/24/2019  1:18 AM      Failed - TSH needs to be rechecked within 3 months after an abnormal result. Refill until TSH is due.      Failed - Valid encounter within last 12 months    Recent Outpatient Visits           1 year ago Need for shingles vaccine   [facility], Lanelle Bal, DO   1 year ago Need for shingles vaccine   [facility], Lanelle Bal, DO   1 year ago Annual physical exam    [facility], Lanelle Bal, DO   2 years ago Annual physical exam   [facility], Lanelle Bal, DO   2 years ago Hashimoto's thyroiditis   [facility], Lanelle Bal, DO              Passed - TSH in normal range and within 360 days    TSH  Date Value Ref Range Status  04/08/2019 0.42 0.40 - 4.50 mIU/L Final

## 2019-06-19 ENCOUNTER — Encounter: Payer: Self-pay | Admitting: Osteopathic Medicine

## 2019-06-20 MED ORDER — KETOCONAZOLE 2 % EX SHAM: 1.0000 "application " | MEDICATED_SHAMPOO | CUTANEOUS | 3 refills | Status: AC

## 2019-10-22 ENCOUNTER — Other Ambulatory Visit: Payer: Self-pay | Admitting: Osteopathic Medicine

## 2019-11-15 ENCOUNTER — Other Ambulatory Visit: Payer: Self-pay | Admitting: Osteopathic Medicine

## 2020-01-18 ENCOUNTER — Encounter: Payer: Self-pay | Admitting: Osteopathic Medicine

## 2020-01-22 ENCOUNTER — Encounter: Payer: Self-pay | Admitting: Gastroenterology

## 2020-03-18 IMAGING — MG DIGITAL SCREENING BILATERAL MAMMOGRAM WITH TOMO AND CAD
8 series · 8 of 24 positions shown · non-contrast
Comparison: Previous exam(s).

CLINICAL DATA: Screening.

EXAM:
DIGITAL SCREENING BILATERAL MAMMOGRAM WITH TOMO AND CAD

[R MLO synth-2D]
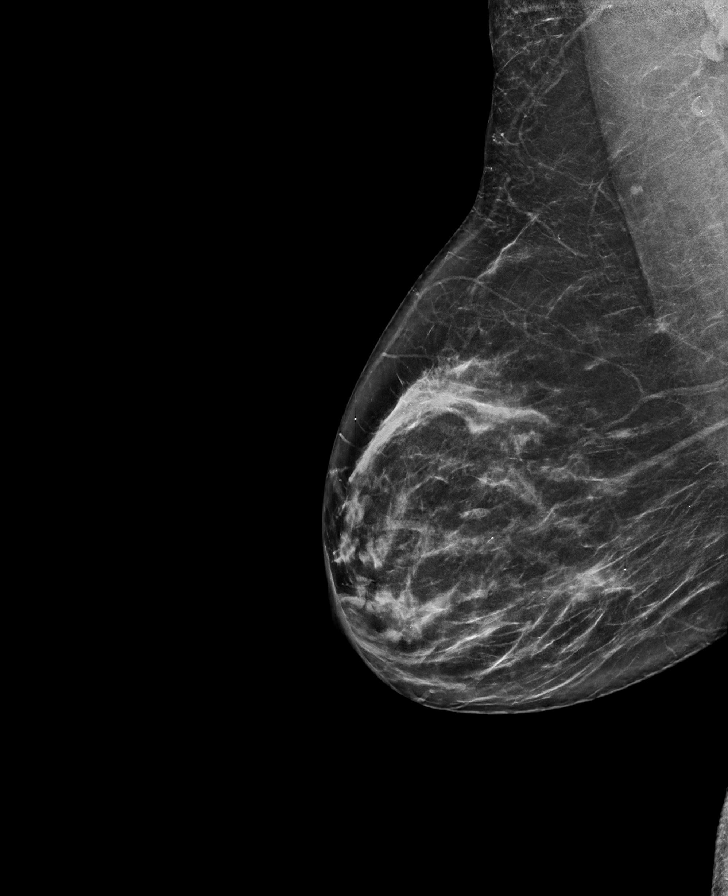

[L MLO synth-2D]
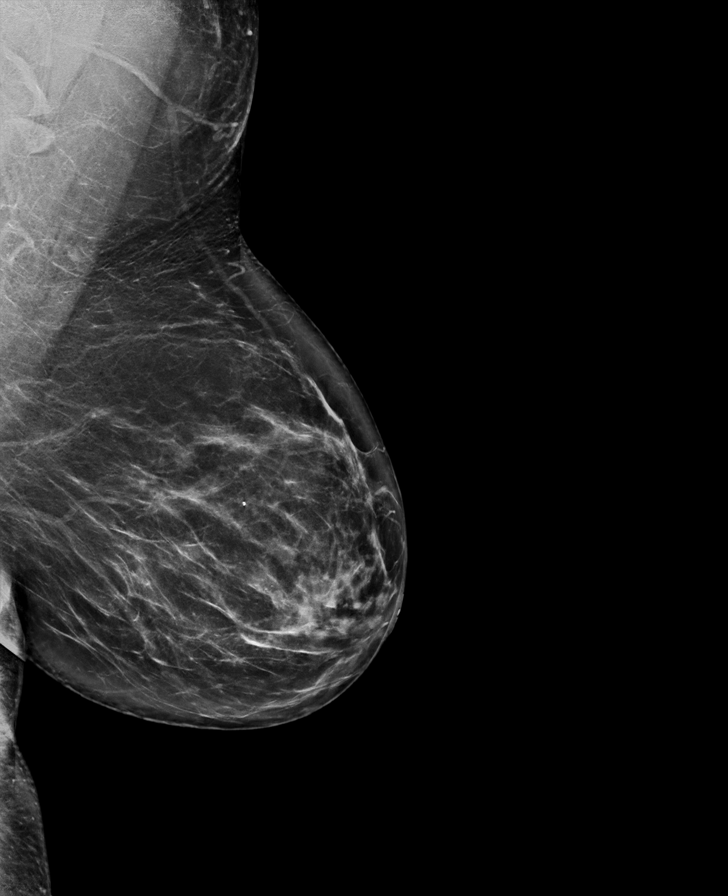

[L CC synth-2D]
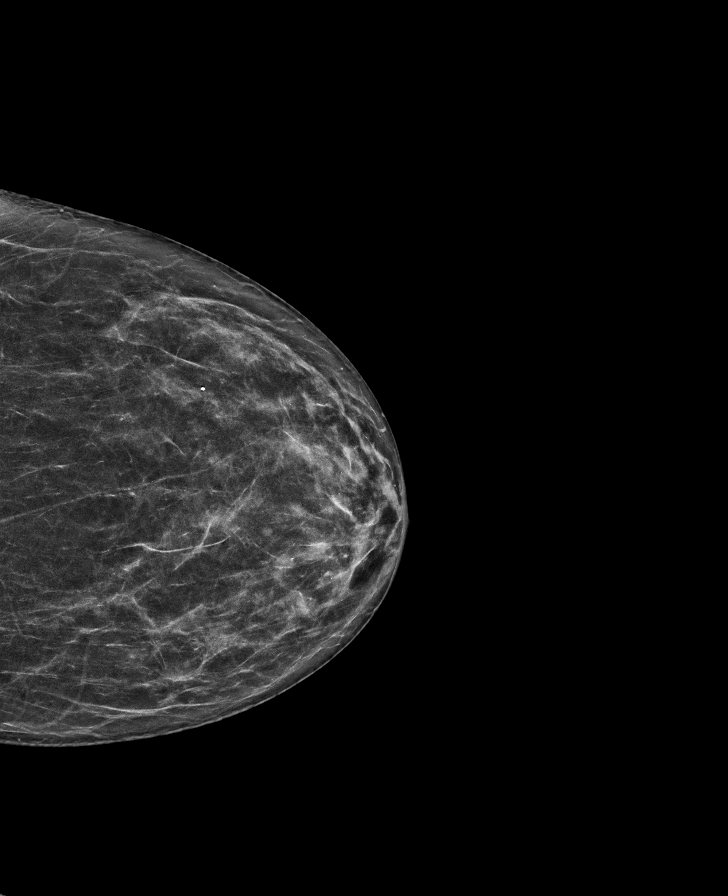

[R CC synth-2D]
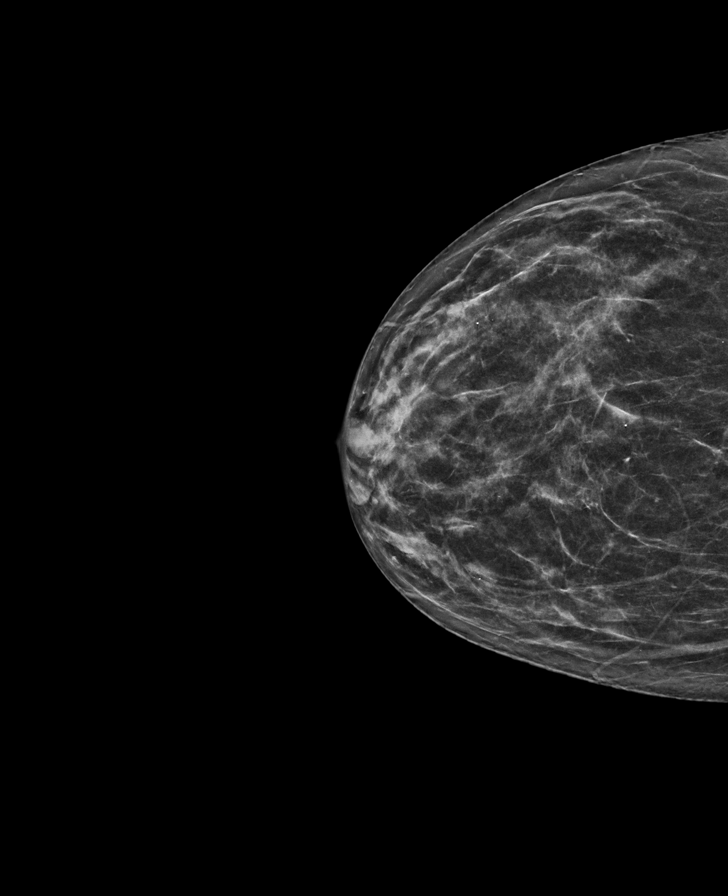

[L MLO tomo · tomo slice 42/83.0]
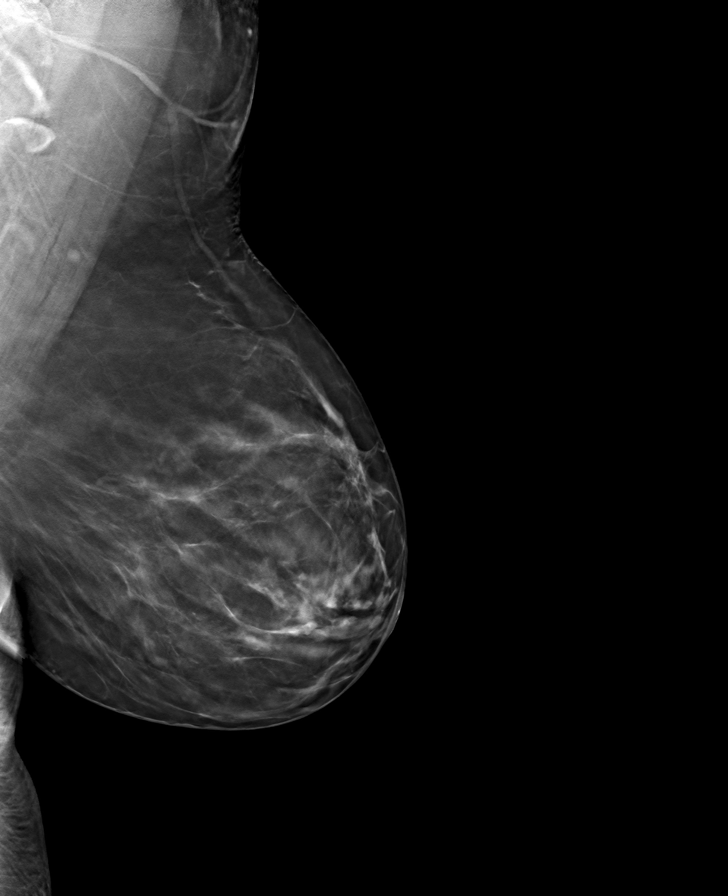

[R CC tomo · tomo slice 29/58.0]
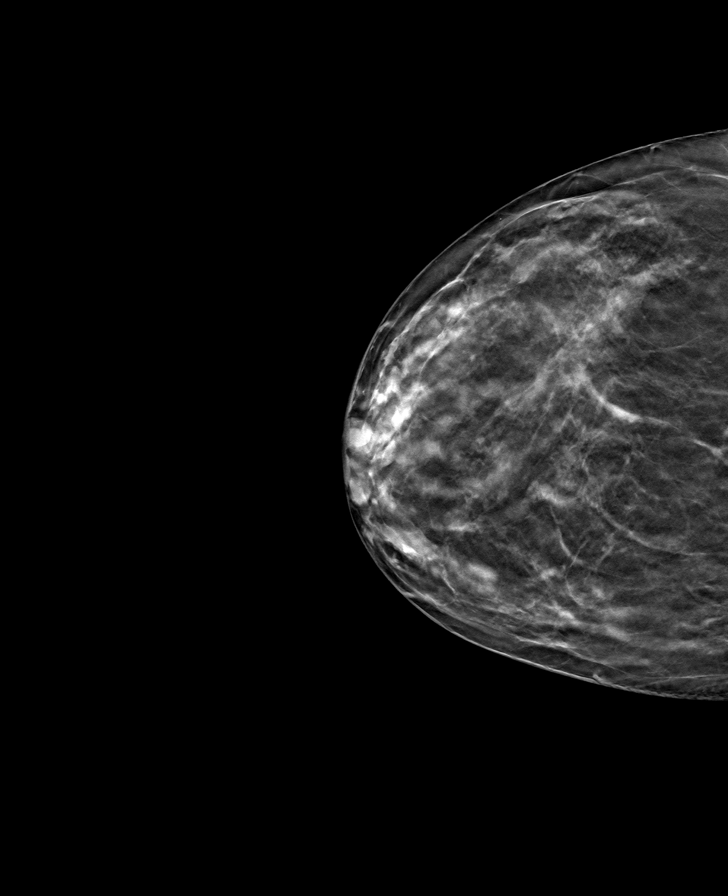

[L CC tomo · tomo slice 32/63.0]
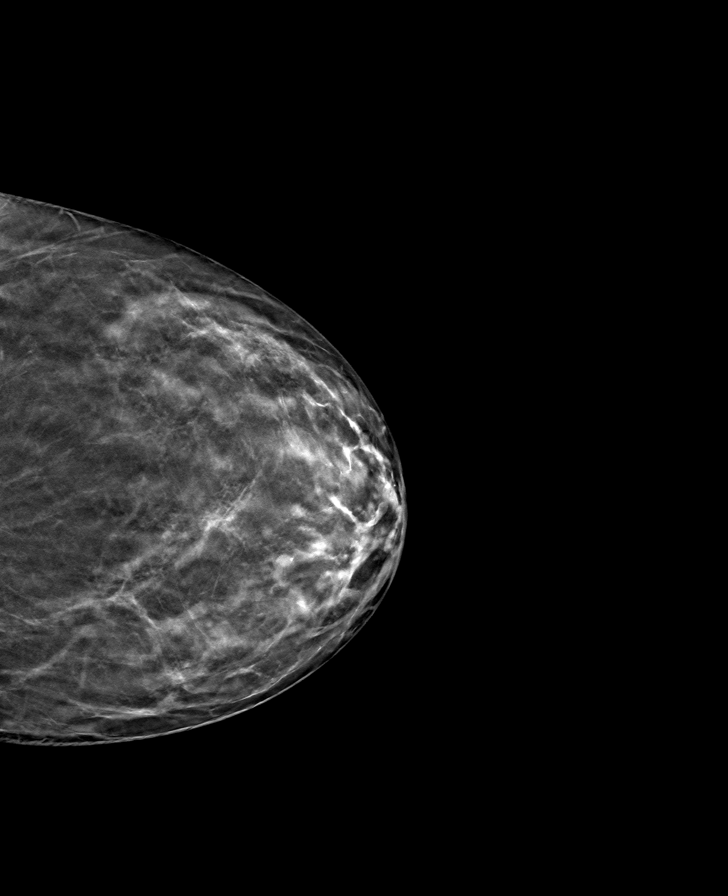

[R MLO tomo · tomo slice 38/75.0]
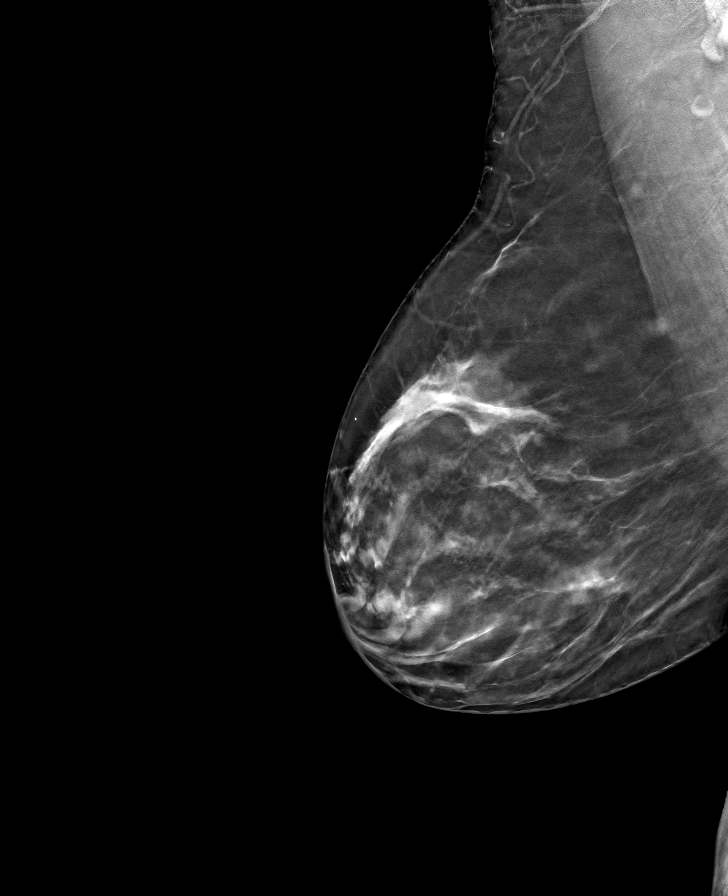

[8 of 24 positions shown; findings below may reference images not displayed]

ACR Breast Density Category b: There are scattered areas of
fibroglandular density.
FINDINGS: There are no findings suspicious for malignancy. Images were
processed with CAD.
IMPRESSION: No mammographic evidence of malignancy. A result letter of this
screening mammogram will be mailed directly to the patient.

RECOMMENDATION:
Screening mammogram in one year. (Code:[id number])

BI-RADS CATEGORY  1: Negative.

## 2020-04-08 ENCOUNTER — Encounter: Payer: Self-pay | Admitting: Gastroenterology

## 2020-04-23 ENCOUNTER — Telehealth: Payer: Self-pay | Admitting: Gastroenterology

## 2020-04-23 NOTE — Telephone Encounter (Signed)
Spoke with pt, she states she has moved to [facility] and will continue her GI care there.

## 2020-06-05 ENCOUNTER — Other Ambulatory Visit: Payer: Self-pay | Admitting: Osteopathic Medicine

## 2020-09-07 ENCOUNTER — Other Ambulatory Visit: Payer: Self-pay | Admitting: Osteopathic Medicine

## 2020-09-08 ENCOUNTER — Other Ambulatory Visit: Payer: Self-pay | Admitting: Osteopathic Medicine
